# Patient Record
Sex: Female | Born: 1995
Health system: Southern US, Community
[De-identification: ages and names within clinical notes are randomized; demographics above are authoritative.]

---
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
## 2014-07-11 HISTORY — PX: OTHER SURGICAL HISTORY: SHX169

## 2014-08-07 ENCOUNTER — Emergency Department (HOSPITAL_COMMUNITY): Payer: Self-pay

## 2014-08-07 ENCOUNTER — Emergency Department (INDEPENDENT_AMBULATORY_CARE_PROVIDER_SITE_OTHER): Payer: Self-pay

## 2014-08-07 ENCOUNTER — Emergency Department (INDEPENDENT_AMBULATORY_CARE_PROVIDER_SITE_OTHER)
Admission: EM | Admit: 2014-08-07 | Discharge: 2014-08-07 | Disposition: A | Discharge status: Home / Self Care | Payer: Self-pay | Source: Home / Self Care | Attending: Family Medicine | Admitting: Family Medicine

## 2014-08-07 ENCOUNTER — Encounter (HOSPITAL_COMMUNITY): Payer: Self-pay | Admitting: Emergency Medicine

## 2014-08-07 DIAGNOSIS — S63619A Unspecified sprain of unspecified finger, initial encounter: Secondary | ICD-10-CM

## 2014-08-07 MED ORDER — IBUPROFEN 100 MG/5ML PO SUSP
600.0000 mg | Freq: Once | ORAL | Status: AC
  Administered 2014-08-07: 600 mg via ORAL

## 2014-08-07 MED ORDER — IBUPROFEN 100 MG PO CHEW: 600.0000 mg | CHEWABLE_TABLET | Freq: Three times a day (TID) | ORAL | Status: DC | PRN

## 2014-08-07 MED ORDER — IBUPROFEN 100 MG/5ML PO SUSP
ORAL | Status: AC
  Filled 2014-08-07: qty 30
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
## 2014-08-07 NOTE — ED Notes (Signed)
Pt escorted to the lobby for transportation to the main hospital for hand xray.
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
## 2014-08-07 NOTE — ED Notes (Signed)
Xray back up and working, pt brought back to room 9 from the lobby.

## 2014-09-09 ENCOUNTER — Emergency Department (INDEPENDENT_AMBULATORY_CARE_PROVIDER_SITE_OTHER): Payer: Self-pay

## 2014-09-09 ENCOUNTER — Encounter (HOSPITAL_COMMUNITY): Payer: Self-pay | Admitting: Emergency Medicine

## 2014-09-09 ENCOUNTER — Emergency Department (INDEPENDENT_AMBULATORY_CARE_PROVIDER_SITE_OTHER)
Admission: EM | Admit: 2014-09-09 | Discharge: 2014-09-09 | Disposition: A | Discharge status: Home / Self Care | Payer: Self-pay | Source: Home / Self Care | Attending: Family Medicine | Admitting: Family Medicine

## 2014-09-09 DIAGNOSIS — M79641 Pain in right hand: Secondary | ICD-10-CM
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
## 2014-09-09 NOTE — Discharge Instructions (Signed)
Musculoskeletal Pain °Musculoskeletal pain is muscle and boney aches and pains. These pains can occur in any part of the body. Your caregiver may treat you without knowing the cause of the pain. They may treat you if blood or urine tests, X-rays, and other tests were normal.  °CAUSES °There is often not a definite cause or reason for these pains. These pains may be caused by a type of germ (virus). The discomfort may also come from overuse. Overuse includes working out too hard when your body is not fit. Boney aches also come from weather changes. Bone is sensitive to atmospheric pressure changes. °HOME CARE INSTRUCTIONS  °· Ask when your test results will be ready. Make sure you get your test results. °· Only take over-the-counter or prescription medicines for pain, discomfort, or fever as directed by your caregiver. If you were given medications for your condition, do not drive, operate machinery or power tools, or sign legal documents for 24 hours. Do not drink alcohol. Do not take sleeping pills or other medications that may interfere with treatment. °· Continue all activities unless the activities cause more pain. When the pain lessens, slowly resume normal activities. Gradually increase the intensity and duration of the activities or exercise. °· During periods of severe pain, bed rest may be helpful. Lay or sit in any position that is comfortable. °· Putting ice on the injured area. °· Put ice in a bag. °· Place a towel between your skin and the bag. °· Leave the ice on for 15 to 20 minutes, 3 to 4 times a day. °· Follow up with your caregiver for continued problems and no reason can be found for the pain. If the pain becomes worse or does not go away, it may be necessary to repeat tests or do additional testing. Your caregiver may need to look further for a possible cause. °SEEK IMMEDIATE MEDICAL CARE IF: °· You have pain that is getting worse and is not relieved by medications. °· You develop chest pain  that is associated with shortness or breath, sweating, feeling sick to your stomach (nauseous), or throw up (vomit). °· Your pain becomes localized to the abdomen. °· You develop any new symptoms that seem different or that concern you. °MAKE SURE YOU:  °· Understand these instructions. °· Will watch your condition. °· Will get help right away if you are not doing well or get worse. °Document Released: 06/27/2005 Document Revised: 09/19/2011 Document Reviewed: 03/01/2013 °ExitCare® Patient Information ©2015 [employer]. This information is not intended to replace advice given to you by your health care provider. Make sure you discuss any questions you have with your health care provider. ° °Pain of Unknown Etiology (Pain Without a Known Cause) °You have come to your caregiver because of pain. Pain can occur in any part of the body. Often there is not a definite cause. If your laboratory (blood or urine) work was normal and X-rays or other studies were normal, your caregiver may treat you without knowing the cause of the pain. An example of this is the headache. Most headaches are diagnosed by taking a history. This means your caregiver asks you questions about your headaches. Your caregiver determines a treatment based on your answers. Usually testing done for headaches is normal. Often testing is not done unless there is no response to medications. Regardless of where your pain is located today, you can be given medications to make you comfortable. If no physical cause of pain can be found, most   cases of pain will gradually leave as suddenly as they came.  °If you have a painful condition and no reason can be found for the pain, it is important that you follow up with your caregiver. If the pain becomes worse or does not go away, it may be necessary to repeat tests and look further for a possible cause. °· Only take over-the-counter or prescription medicines for pain, discomfort, or fever as directed by your  caregiver. °· For the protection of your privacy, test results cannot be given over the phone. Make sure you receive the results of your test. Ask how these results are to be obtained if you have not been informed. It is your responsibility to obtain your test results. °· You may continue all activities unless the activities cause more pain. When the pain lessens, it is important to gradually resume normal activities. Resume activities by beginning slowly and gradually increasing the intensity and duration of the activities or exercise. During periods of severe pain, bed rest may be helpful. Lie or sit in any position that is comfortable. °· Ice used for acute (sudden) conditions may be effective. Use a large plastic bag filled with ice and wrapped in a towel. This may provide pain relief. °· See your caregiver for continued problems. Your caregiver can help or refer you for exercises or physical therapy if necessary. °If you were given medications for your condition, do not drive, operate machinery or power tools, or sign legal documents for 24 hours. Do not drink alcohol, take sleeping pills, or take other medications that may interfere with treatment. °See your caregiver immediately if you have pain that is becoming worse and not relieved by medications. °Document Released: 03/22/2001 Document Revised: 04/17/2013 Document Reviewed: 06/27/2005 °ExitCare® Patient Information ©2015 [employer]. This information is not intended to replace advice given to you by your health care provider. Make sure you discuss any questions you have with your health care provider. ° °

## 2015-07-28 ENCOUNTER — Ambulatory Visit (INDEPENDENT_AMBULATORY_CARE_PROVIDER_SITE_OTHER): Payer: BC Managed Care – PPO | Admitting: Family Medicine

## 2015-07-28 ENCOUNTER — Encounter: Payer: Self-pay | Admitting: Family Medicine

## 2015-07-28 VITALS — BP 108/72 | HR 69 | Temp 98.9°F | Resp 16 | Ht 65.0 in | Wt 200.0 lb

## 2015-07-28 DIAGNOSIS — J039 Acute tonsillitis, unspecified: Secondary | ICD-10-CM

## 2015-07-28 LAB — POCT RAPID STREP A (OFFICE): Rapid Strep A Screen: NEGATIVE

## 2015-07-28 MED ORDER — AZITHROMYCIN 250 MG PO TABS: ORAL_TABLET | ORAL | Status: DC

## 2015-07-28 NOTE — Progress Notes (Signed)
Subjective:    Patient ID: Laurie Mendez, female    DOB: 10/11/1995, 20 y.o.   MRN: [id number]  HPI: Laurie Mendez is a 20 y.o. female presenting on 07/28/2015 for white bump around tonsil   HPI   Pt presents for possible strep throat. Throat discomfort when she swallows. No fevers at home. No congestion. No nasal drainage. No ear pain or chest tightness.    No past medical history on file.  No current outpatient prescriptions on file prior to visit.   No current facility-administered medications on file prior to visit.    Review of Systems  Constitutional: Negative for fever and chills.  HENT: Positive for sore throat and trouble swallowing. Negative for congestion, ear pain, facial swelling, postnasal drip, rhinorrhea and sinus pressure.   Respiratory: Negative for cough, chest tightness and wheezing.   Cardiovascular: Negative for chest pain, palpitations and leg swelling.  Skin: Negative for rash and wound.   Per HPI unless specifically indicated above     Objective:    BP 108/72 mmHg  Pulse 69  Temp(Src) 98.9 F (37.2 C) (Oral)  Resp 16  Ht  (1.651 m)  Wt 200 lb (90.719 kg)  BMI 33.28 kg/m2  LMP 07/27/2015  Wt Readings from Last 3 Encounters:  07/28/15 200 lb (90.719 kg)  09/11/08 131 lb (59.421 kg) (86 %*, Z = 1.09)  02/20/08 114 lb 8 oz (51.937 kg) (76 %*, Z = 0.72)   * Growth percentiles are based on CDC 2-20 Years data.    Physical Exam  HENT:  Head: Normocephalic and atraumatic.  Right Ear: Hearing and tympanic membrane normal.  Left Ear: Hearing and tympanic membrane normal.  Nose: No mucosal edema or rhinorrhea. Right sinus exhibits no maxillary sinus tenderness and no frontal sinus tenderness. Left sinus exhibits no maxillary sinus tenderness and no frontal sinus tenderness.  Mouth/Throat: Uvula is midline.    Neck: Normal range of motion. Neck supple. No Brudzinski's sign and no Kernig's sign noted.  Cardiovascular: Normal rate and regular  rhythm.  Exam reveals no gallop and no friction rub.   No murmur heard. Pulmonary/Chest: Effort normal and breath sounds normal. No respiratory distress. She has no wheezes. She has no rales.  Lymphadenopathy:    She has no cervical adenopathy.   Results for orders placed or performed in visit on 07/28/15  POCT rapid strep A  Result Value Ref Range   Rapid Strep A Screen Negative Negative      Assessment & Plan:   Problem List Items Addressed This Visit    None    Visit Diagnoses    Exudative tonsillitis    -  Primary    Rapid strep is negative. Sent for culture. Treat empirically due to exudate, beefy red. Zpak for PCN allergy. Supportive care at home. Return if not improved.     Relevant Orders    POCT rapid strep A (Completed)    Grp A Strep       Meds ordered this encounter  Medications  . azithromycin (ZITHROMAX) 250 MG tablet    Sig: Dispense as Z-pak: Take 2 pills today and 1 pill every day until the bottle is empty.    Dispense:  6 tablet    Refill:  0    Order Specific Question:  Supervising Provider    Answer:  Janeann Forehand [[id number]]      Follow up plan: Return if symptoms worsen or fail to improve.

## 2015-07-28 NOTE — Patient Instructions (Signed)

## 2015-07-30 LAB — CULTURE, GROUP A STREP
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
## 2016-01-21 ENCOUNTER — Encounter: Payer: Self-pay | Admitting: Family Medicine

## 2016-01-21 ENCOUNTER — Ambulatory Visit (INDEPENDENT_AMBULATORY_CARE_PROVIDER_SITE_OTHER): Payer: BC Managed Care – PPO | Admitting: Family Medicine

## 2016-01-21 VITALS — BP 97/62 | HR 66 | Temp 98.4°F | Resp 16 | Ht 65.0 in | Wt 191.0 lb

## 2016-01-21 DIAGNOSIS — Z0000 Encounter for general adult medical examination without abnormal findings: Secondary | ICD-10-CM

## 2016-01-21 NOTE — Progress Notes (Signed)
Subjective:    Patient ID: Laurie Mendez, female    DOB: 09/02/1995, 20 y.o.   MRN: [id number]  HPI: Laurie Mendez is a 20 y.o. female presenting on 01/21/2016 for Annual Exam   HPI  Pt presents annual exam. She is attending [employer] and needs paperwork done. Overall doing well. Wears sunscreen when outside. Checks skins. Exercise- regularly. 20minutes on ellipicital 3x per week. Declines GC/chlamydia testing today.  LMP 7/13- no issues. No sexually active.  Non-smoker. No ETOH.   No past medical history on file. Social History   Social History  . Marital Status: Single    Spouse Name: N/A  . Number of Children: N/A  . Years of Education: N/A   Occupational History  . Not on file.   Social History Main Topics  . Smoking status: Never Smoker   . Smokeless tobacco: Never Used  . Alcohol Use: No  . Drug Use: No  . Sexual Activity: Not on file   Other Topics Concern  . Not on file   Social History Narrative   Family History  Problem Relation Age of Onset  . Cancer Neg Hx   . Diabetes Neg Hx   . Heart failure Neg Hx   . Healthy Mother   . Healthy Father   . Healthy Sister   . Healthy Sister    No current outpatient prescriptions on file prior to visit.   No current facility-administered medications on file prior to visit.    Review of Systems  Constitutional: Negative for fever and chills.  HENT: Negative.   Respiratory: Negative for cough, chest tightness and wheezing.   Cardiovascular: Negative for chest pain and leg swelling.  Gastrointestinal: Negative for nausea, vomiting, abdominal pain, diarrhea and constipation.  Endocrine: Negative.  Negative for cold intolerance, heat intolerance, polydipsia, polyphagia and polyuria.  Genitourinary: Negative for dysuria and difficulty urinating.  Musculoskeletal: Negative.   Neurological: Negative for dizziness, light-headedness and numbness.  Psychiatric/Behavioral: Negative.    Per HPI unless specifically indicated  above     Objective:    BP 97/62 mmHg  Pulse 66  Temp(Src) 98.4 F (36.9 C) (Oral)  Resp 16  Ht 5\' 5"  (1.651 m)  Wt 191 lb (86.637 kg)  BMI 31.78 kg/m2  SpO2 100%  LMP 01/21/2016 (Exact Date)  Wt Readings from Last 3 Encounters:  01/21/16 191 lb (86.637 kg)  07/28/15 200 lb (90.719 kg)  09/11/08 131 lb (59.421 kg) (86 %*, Z = 1.09)   * Growth percentiles are based on CDC 2-20 Years data.    Physical Exam  Constitutional: She is oriented to person, place, and time. She appears well-developed and well-nourished.  HENT:  Head: Normocephalic and atraumatic.  Right Ear: Hearing normal. A middle ear effusion is present.  Left Ear: Hearing and tympanic membrane normal.  Nose: Right sinus exhibits no maxillary sinus tenderness and no frontal sinus tenderness. Left sinus exhibits no maxillary sinus tenderness and no frontal sinus tenderness.  Mouth/Throat: Uvula is midline, oropharynx is clear and moist and mucous membranes are normal.  Neck: Neck supple.  Cardiovascular: Normal rate, regular rhythm and normal heart sounds.  Exam reveals no gallop and no friction rub.   No murmur heard. Pulmonary/Chest: Effort normal and breath sounds normal. She has no wheezes. She exhibits no tenderness.  Abdominal: Soft. Normal appearance and bowel sounds are normal. She exhibits no distension and no mass. There is no tenderness. There is no rebound and no guarding.  Musculoskeletal: Normal  range of motion. She exhibits no edema or tenderness.  Lymphadenopathy:    She has no cervical adenopathy.  Neurological: She is alert and oriented to person, place, and time.  Skin: Skin is warm and dry.  Psychiatric: She has a normal mood and affect. Her behavior is normal. Judgment and thought content normal.   Results for orders placed or performed in visit on 07/28/15  Culture, Group A Strep  Result Value Ref Range   Strep A Culture Comment (A)   POCT rapid strep A  Result Value Ref Range   Rapid  Strep A Screen Negative Negative      Assessment & Plan:   Problem List Items Addressed This Visit    None    Visit Diagnoses    Annual physical exam    -  Primary    Well young adult. Cleared for health sciences studies. Health maintenance reviewed. Declined GC/chlamydia testing. Health maintenace labs done.     Relevant Orders    COMPLETE METABOLIC PANEL WITH GFR    Lipid panel       No orders of the defined types were placed in this encounter.      Follow up plan: Return in about 1 year (around 01/20/2017), or if symptoms worsen or fail to improve.

## 2016-01-21 NOTE — Patient Instructions (Signed)
Keep home and car smoke-free Stay physically active (>30-60 minutes 3 times a day) Maximum 1-2 hours of TV & computer a day Wear seatbelts, ensure passengers do too Drive responsibly when you get your license Avoid alcohol, smoking, drug use Ride with designated driver or call for a ride if drinking Abstinence from sex is the best way to avoid pregnancy and STDs Limit sun, use sunscreen Seek help if you feel angry, depressed, or sad often 3 meals a day and healthy snacks Brush teeth twice a day Participate in social activities, sports, community groups Maintain strong family relationships Follow up in 1 year  

## 2016-01-22 LAB — COMPLETE METABOLIC PANEL WITH GFR
ALT: 15 U/L (ref 6–29)
AST: 17 U/L (ref 10–30)
Albumin: 4.2 g/dL (ref 3.6–5.1)
Alkaline Phosphatase: 60 U/L (ref 33–115)
BILIRUBIN TOTAL: 0.7 mg/dL (ref 0.2–1.2)
BUN: 11 mg/dL (ref 7–25)
CO2: 27 mmol/L (ref 20–31)
CREATININE: 0.88 mg/dL (ref 0.50–1.10)
Calcium: 9.6 mg/dL (ref 8.6–10.2)
Chloride: 103 mmol/L (ref 98–110)
GFR, Est Non African American: >89 mL/min (ref >=60)
Glucose, Bld: 91 mg/dL (ref 65–99)
Potassium: 4.1 mmol/L (ref 3.5–5.3)
Sodium: 143 mmol/L (ref 135–146)
TOTAL PROTEIN: 6.7 g/dL (ref 6.1–8.1)

## 2016-01-22 LAB — LIPID PANEL
Cholesterol: 164 mg/dL (ref 125–170)
HDL: 36 mg/dL — ABNORMAL LOW (ref >=46)
LDL CALC: 98 mg/dL (ref <110)
TRIGLYCERIDES: 152 mg/dL — AB (ref <150)
Total CHOL/HDL Ratio: 4.6 Ratio (ref <=5.0)
VLDL: 30 mg/dL (ref <30)

## 2016-03-11 ENCOUNTER — Ambulatory Visit (INDEPENDENT_AMBULATORY_CARE_PROVIDER_SITE_OTHER): Payer: BC Managed Care – PPO | Admitting: Family Medicine

## 2016-03-11 VITALS — BP 117/67 | HR 66 | Temp 97.8°F | Resp 16 | Ht 65.0 in | Wt 194.0 lb

## 2016-03-11 DIAGNOSIS — J029 Acute pharyngitis, unspecified: Secondary | ICD-10-CM

## 2016-03-11 LAB — POCT RAPID STREP A (OFFICE): RAPID STREP A SCREEN: NEGATIVE

## 2016-03-11 NOTE — Progress Notes (Signed)
   Subjective:    Patient ID: Laurie Mendez, female    DOB: 01/26/1996, 20 y.o.   MRN: [id number]  HPI: Laurie Mendez is a 20 y.o. female presenting on 03/11/2016 for Sore Throat (onset yesterday,  HA )   HPI  Pt presents headache and sore throat. Started yesterday. No fevers. Some congestion today. Sicks contacts at school. No cough.   No past medical history on file.  No current outpatient prescriptions on file prior to visit.   No current facility-administered medications on file prior to visit.     Review of Systems  Constitutional: Negative for chills and fever.  HENT: Positive for congestion and sore throat. Negative for ear pain, rhinorrhea, trouble swallowing and voice change.   Respiratory: Negative for cough, chest tightness and wheezing.   Cardiovascular: Negative for chest pain, palpitations and leg swelling.  Gastrointestinal: Negative for vomiting.  Neurological: Positive for headaches.   Per HPI unless specifically indicated above     Objective:    BP 117/67 (BP Location: Right Arm, Patient Position: Sitting, Cuff Size: Normal)   Pulse 66   Temp 97.8 F (36.6 C) (Oral)   Resp 16   Ht 5\' 5"  (1.651 m)   Wt 194 lb (88 kg)   SpO2 100%   BMI 32.28 kg/m   Wt Readings from Last 3 Encounters:  03/11/16 194 lb (88 kg)  01/21/16 191 lb (86.6 kg)  07/28/15 200 lb (90.7 kg)    Physical Exam  Constitutional: She appears well-developed and well-nourished. No distress.  HENT:  Head: Normocephalic and atraumatic.  Right Ear: Hearing and tympanic membrane normal. Tympanic membrane is not erythematous and not bulging.  Left Ear: Hearing normal. Tympanic membrane is not erythematous and not bulging. A middle ear effusion is present.  Nose: Mucosal edema present. No sinus tenderness or nasal septal hematoma. Right sinus exhibits no maxillary sinus tenderness and no frontal sinus tenderness. Left sinus exhibits no maxillary sinus tenderness and no frontal sinus tenderness.    Mouth/Throat: Uvula is midline and mucous membranes are normal. No uvula swelling. Posterior oropharyngeal erythema present. No posterior oropharyngeal edema or tonsillar abscesses.  Mild erythema of the throat- consistent with viral illness  Neck: Neck supple. No Brudzinski's sign and no Kernig's sign noted.  Cardiovascular: Normal rate, regular rhythm and normal heart sounds.   Pulmonary/Chest: Breath sounds normal. No accessory muscle usage. No tachypnea. No respiratory distress.  Lymphadenopathy:    She has no cervical adenopathy.   Results for orders placed or performed in visit on 03/11/16  POCT rapid strep A  Result Value Ref Range   Rapid Strep A Screen Negative Negative      Assessment & Plan:   Problem List Items Addressed This Visit    None    Visit Diagnoses    Pharyngitis    -  Primary   Likely viral etiology. Rapid strep is negative. Supportive care. Will cutlure and treat if positive. Return if not improving.    Relevant Orders   POCT rapid strep A (Completed)   Culture, Group A Strep      Meds ordered this encounter  Medications  . ENGERIX-B 20 MCG/ML injection      Follow up plan: Return if symptoms worsen or fail to improve.

## 2016-03-11 NOTE — Patient Instructions (Addendum)

## 2016-03-13 LAB — CULTURE, GROUP A STREP: Organism ID, Bacteria: NORMAL

## 2020-11-12 ENCOUNTER — Encounter: Payer: Self-pay | Admitting: Family Medicine

## 2020-11-12 ENCOUNTER — Ambulatory Visit (INDEPENDENT_AMBULATORY_CARE_PROVIDER_SITE_OTHER): Payer: No Typology Code available for payment source | Admitting: Family Medicine

## 2020-11-12 ENCOUNTER — Other Ambulatory Visit: Payer: Self-pay

## 2020-11-12 VITALS — BP 112/61 | HR 95 | Ht 64.5 in | Wt 232.6 lb

## 2020-11-12 DIAGNOSIS — Z1159 Encounter for screening for other viral diseases: Secondary | ICD-10-CM | POA: Diagnosis not present

## 2020-11-12 DIAGNOSIS — E669 Obesity, unspecified: Secondary | ICD-10-CM

## 2020-11-12 DIAGNOSIS — Z114 Encounter for screening for human immunodeficiency virus [HIV]: Secondary | ICD-10-CM

## 2020-11-12 DIAGNOSIS — Z0000 Encounter for general adult medical examination without abnormal findings: Secondary | ICD-10-CM

## 2020-11-12 NOTE — Patient Instructions (Addendum)
Thank you for coming to the office today.  Labs today  OTC Peppermint Oil (Triple Coated Capsule) 180mg  take one 3 times daily to reduce diarrhea   Please schedule a Follow-up Appointment to: Return in about 1 year (around 11/12/2021) for 1 year Annual Physical (AM apt, fasting lab AFTER).  If you have any other questions or concerns, please feel free to call the office or send a message through MyChart. You may also schedule an earlier appointment if necessary.  Additionally, you may be receiving a survey about your experience at our office within a few days to 1 week by e-mail or mail. We value your feedback.  01/12/2022, DO [facility], [facility]

## 2020-11-12 NOTE — Progress Notes (Signed)
Subjective:    Patient ID: Laurie Mendez, female    DOB: 10/29/1995, 25 y.o.   MRN: [id number]  Laurie Mendez is a 25 y.o. female presenting on 11/12/2020 for Annual Exam   HPI   Here for Annual Physical and due for fasting labs. Biometric for Physical / [employer] Employee  She works as Publishing rights manager in [employer]  Obesity BMI >39 Gradual wt gain over past 5 years. Lifestyle Diet - balanced, meal prep daily Exercise - days off work 2 days a week 30 min approx cardio  GERD Suspected GI IBS with predominant diarrhea type On Nexium 20mg  OTC mostly as needed, not taking every day. Can take for 1 week at a time or PRN. Avoids trigger foods.  Yearly with OBGYN / [facility] On Depo  Health Maintenance:  UTD TDap 2016 UTD COVID Vaccine, booster Due HPV Vaccine  Last pap smear [facility] 10/22/20 - reported negative, will get copy of result, last pap was 10/2019 negative.  Depression screen PHQ 2/9 11/12/2020  Decreased Interest 0  Down, Depressed, Hopeless 0  PHQ - 2 Score 0    History reviewed. No pertinent past medical history. Past Surgical History:  Procedure Laterality Date  . oral surgery  2016   wisdome teeth    Social History   Socioeconomic History  . Marital status: Single    Spouse name: Not on file  . Number of children: Not on file  . Years of education: Not on file  . Highest education level: Not on file  Occupational History  . Not on file  Tobacco Use  . Smoking status: Never Smoker  . Smokeless tobacco: Never Used  Substance and Sexual Activity  . Alcohol use: No  . Drug use: No  . Sexual activity: Not on file  Other Topics Concern  . Not on file  Social History Narrative  . Not on file   Social Determinants of Health   Financial Resource Strain: Not on file  Food Insecurity: Not on file  Transportation Needs: Not on file  Physical Activity: Not on file  Stress: Not on file  Social Connections: Not on  file  Intimate Partner Violence: Not on file   Family History  Problem Relation Age of Onset  . Healthy Mother   . Healthy Father   . Healthy Sister   . Healthy Sister   . Atrial fibrillation Maternal Grandmother   . Suicidality Maternal Grandfather   . Depression Maternal Grandfather   . Prostate cancer Maternal Grandfather   . Diabetes Maternal Grandfather   . Heart disease Paternal Grandfather   . Cancer Neg Hx   . Heart failure Neg Hx    Current Outpatient Medications on File Prior to Visit  Medication Sig  . esomeprazole (NEXIUM) 20 MG capsule Take 20 mg by mouth daily at 12 noon.  . medroxyPROGESTERone Acetate 150 MG/ML SUSY Inject 1 mL into the muscle every 3 (three) months.   No current facility-administered medications on file prior to visit.    Review of Systems  Constitutional: Negative for activity change, appetite change, chills, diaphoresis, fatigue and fever.  HENT: Negative for congestion and hearing loss.   Eyes: Negative for visual disturbance.  Respiratory: Negative for cough, chest tightness, shortness of breath and wheezing.   Cardiovascular: Negative for chest pain, palpitations and leg swelling.  Gastrointestinal: Negative for abdominal pain, constipation, diarrhea, nausea and vomiting.  Genitourinary: Negative for dysuria, frequency and hematuria.  Musculoskeletal: Negative for arthralgias and neck pain.  Skin: Negative for rash.  Allergic/Immunologic: Negative for environmental allergies.  Neurological: Negative for dizziness, weakness, light-headedness, numbness and headaches.  Hematological: Negative for adenopathy.  Psychiatric/Behavioral: Negative for behavioral problems, dysphoric mood and sleep disturbance.   Per HPI unless specifically indicated above     Objective:    BP 112/61   Pulse 95   Ht 5' 4.5" (1.638 m)   Wt 232 lb 9.6 oz (105.5 kg)   SpO2 100%   BMI 39.31 kg/m   Wt Readings from Last 3 Encounters:  11/12/20 232 lb 9.6 oz  (105.5 kg)  03/11/16 194 lb (88 kg)  01/21/16 191 lb (86.6 kg)    Physical Exam Vitals and nursing note reviewed.  Constitutional:      General: She is not in acute distress.    Appearance: She is well-developed. She is obese. She is not diaphoretic.     Comments: Well-appearing, comfortable, cooperative  HENT:     Head: Normocephalic and atraumatic.     Right Ear: Tympanic membrane, ear canal and external ear normal. There is no impacted cerumen.     Left Ear: Tympanic membrane, ear canal and external ear normal. There is no impacted cerumen.  Eyes:     General:        Right eye: No discharge.        Left eye: No discharge.     Conjunctiva/sclera: Conjunctivae normal.     Pupils: Pupils are equal, round, and reactive to light.  Neck:     Thyroid: No thyromegaly.  Cardiovascular:     Rate and Rhythm: Normal rate and regular rhythm.     Heart sounds: Normal heart sounds. No murmur heard.   Pulmonary:     Effort: Pulmonary effort is normal. No respiratory distress.     Breath sounds: Normal breath sounds. No wheezing or rales.  Abdominal:     General: Bowel sounds are normal. There is no distension.     Palpations: Abdomen is soft. There is no mass.     Tenderness: There is no abdominal tenderness.  Musculoskeletal:        General: No tenderness. Normal range of motion.     Cervical back: Normal range of motion and neck supple.     Right lower leg: No edema.     Left lower leg: No edema.     Comments: Upper / Lower Extremities: - Normal muscle tone, strength bilateral upper extremities 5/5, lower extremities 5/5  Lymphadenopathy:     Cervical: No cervical adenopathy.  Skin:    General: Skin is warm and dry.     Findings: No erythema or rash.  Neurological:     Mental Status: She is alert and oriented to person, place, and time.     Comments: Distal sensation intact to light touch all extremities  Psychiatric:        Behavior: Behavior normal.     Comments: Well  groomed, good eye contact, normal speech and thoughts       Results for orders placed or performed in visit on 03/11/16  Culture, Group A Strep   Specimen: Throat  Result Value Ref Range   Organism ID, Bacteria Normal Upper Respiratory Flora    Organism ID, Bacteria No Beta Hemolytic Streptococci Isolated   POCT rapid strep A  Result Value Ref Range   Rapid Strep A Screen Negative Negative      Assessment & Plan:   Problem List  Items Addressed This Visit   None   Visit Diagnoses    Annual physical exam    -  Primary   Relevant Orders   COMPLETE METABOLIC PANEL WITH GFR   Lipid panel   CBC with Differential/Platelet   Need for hepatitis C screening test       Relevant Orders   Hepatitis C antibody   Screening for HIV (human immunodeficiency virus)       Relevant Orders   HIV Antibody (routine testing w rflx)   Obesity (BMI 35.0-39.9 without comorbidity)          Updated Health Maintenance information Request copy of last pap smear from [facility] 10/22/20 reported negative, last 10/2019 negative Fasting labs ordered today to be drawn, including biometric and routine labs Add screen routine Hep C / HIV Review [facility] record, due for HPV vaccine she will check into this and return when ready. Encouraged improvement to lifestyle with diet and exercise Goal of weight loss   No orders of the defined types were placed in this encounter.   Follow up plan: Return in about 1 year (around 11/12/2021) for 1 year Annual Physical (AM apt, fasting lab AFTER).  Saralyn Pilar, DO [facility] 11/12/2020, 10:14 AM

## 2020-11-13 LAB — CBC WITH DIFFERENTIAL/PLATELET
Absolute Monocytes: 544 cells/uL (ref 200–950)
Basophils Absolute: 48 cells/uL (ref 0–200)
Basophils Relative: 0.6 %
Eosinophils Absolute: 168 cells/uL (ref 15–500)
Eosinophils Relative: 2.1 %
HCT: 47 % — ABNORMAL HIGH (ref 35.0–45.0)
Hemoglobin: 15.5 g/dL (ref 11.7–15.5)
Lymphs Abs: 2296 cells/uL (ref 850–3900)
MCH: 30.7 pg (ref 27.0–33.0)
MCHC: 33 g/dL (ref 32.0–36.0)
MCV: 93.1 fL (ref 80.0–100.0)
MPV: 9.8 fL (ref 7.5–12.5)
Monocytes Relative: 6.8 %
Neutro Abs: 4944 cells/uL (ref 1500–7800)
Neutrophils Relative %: 61.8 %
Platelets: 421 10*3/uL — ABNORMAL HIGH (ref 140–400)
RBC: 5.05 10*6/uL (ref 3.80–5.10)
RDW: 12.7 % (ref 11.0–15.0)
Total Lymphocyte: 28.7 %
WBC: 8 10*3/uL (ref 3.8–10.8)

## 2020-11-13 LAB — HEPATITIS C ANTIBODY
Hepatitis C Ab: NONREACTIVE
SIGNAL TO CUT-OFF: 0 (ref <1.00)

## 2020-11-13 LAB — COMPLETE METABOLIC PANEL WITH GFR
AG Ratio: 1.7 (calc) (ref 1.0–2.5)
ALT: 34 U/L — ABNORMAL HIGH (ref 6–29)
AST: 23 U/L (ref 10–30)
Albumin: 4.7 g/dL (ref 3.6–5.1)
Alkaline phosphatase (APISO): 89 U/L (ref 31–125)
BUN: 10 mg/dL (ref 7–25)
CO2: 29 mmol/L (ref 20–32)
Calcium: 10.2 mg/dL (ref 8.6–10.2)
Chloride: 104 mmol/L (ref 98–110)
Creat: 0.73 mg/dL (ref 0.50–1.10)
GFR, Est African American: 133 mL/min/{1.73_m2} (ref >=60)
GFR, Est Non African American: 114 mL/min/{1.73_m2} (ref >=60)
Globulin: 2.7 g/dL (calc) (ref 1.9–3.7)
Glucose, Bld: 89 mg/dL (ref 65–99)
Potassium: 4.4 mmol/L (ref 3.5–5.3)
Sodium: 140 mmol/L (ref 135–146)
Total Bilirubin: 1.2 mg/dL (ref 0.2–1.2)
Total Protein: 7.4 g/dL (ref 6.1–8.1)

## 2020-11-13 LAB — LIPID PANEL
Cholesterol: 200 mg/dL — ABNORMAL HIGH (ref <200)
HDL: 41 mg/dL — ABNORMAL LOW (ref >=50)
LDL Cholesterol (Calc): 127 mg/dL (calc) — ABNORMAL HIGH
Non-HDL Cholesterol (Calc): 159 mg/dL (calc) — ABNORMAL HIGH (ref <130)
Total CHOL/HDL Ratio: 4.9 (calc) (ref <5.0)
Triglycerides: 196 mg/dL — ABNORMAL HIGH (ref <150)

## 2020-11-13 LAB — HIV ANTIBODY (ROUTINE TESTING W REFLEX): HIV 1&2 Ab, 4th Generation: NONREACTIVE

## 2021-07-12 ENCOUNTER — Telehealth: Payer: Self-pay | Admitting: Physician Assistant

## 2021-07-12 DIAGNOSIS — J019 Acute sinusitis, unspecified: Secondary | ICD-10-CM

## 2021-07-12 DIAGNOSIS — B9689 Other specified bacterial agents as the cause of diseases classified elsewhere: Secondary | ICD-10-CM

## 2021-07-12 MED ORDER — DOXYCYCLINE HYCLATE 100 MG PO TABS: 100.0000 mg | ORAL_TABLET | Freq: Two times a day (BID) | ORAL | 0 refills | Status: DC

## 2021-07-12 NOTE — Progress Notes (Signed)

## 2021-09-07 ENCOUNTER — Telehealth: Payer: No Typology Code available for payment source | Admitting: Physician Assistant

## 2021-09-07 DIAGNOSIS — B9689 Other specified bacterial agents as the cause of diseases classified elsewhere: Secondary | ICD-10-CM

## 2021-09-07 DIAGNOSIS — J019 Acute sinusitis, unspecified: Secondary | ICD-10-CM

## 2021-09-07 MED ORDER — DOXYCYCLINE HYCLATE 100 MG PO TABS: 100.0000 mg | ORAL_TABLET | Freq: Two times a day (BID) | ORAL | 0 refills | Status: DC

## 2021-09-07 NOTE — Progress Notes (Signed)

## 2021-09-07 NOTE — Progress Notes (Signed)
I have spent 5 minutes in review of e-visit questionnaire, review and updating patient chart, medical decision making and response to patient.   Jordin Vicencio [employer], PA-C    

## 2021-11-01 ENCOUNTER — Telehealth: Payer: No Typology Code available for payment source | Admitting: Physician Assistant

## 2021-11-01 DIAGNOSIS — J020 Streptococcal pharyngitis: Secondary | ICD-10-CM

## 2021-11-01 MED ORDER — CLINDAMYCIN HCL 300 MG PO CAPS: 300.0000 mg | ORAL_CAPSULE | Freq: Three times a day (TID) | ORAL | 0 refills | Status: DC

## 2021-11-01 NOTE — Progress Notes (Signed)
E-Visit for Sore Throat - Strep Symptoms  We are sorry that you are not feeling well.  Here is how we plan to help!  Based on what you have shared with me it is likely that you have strep pharyngitis.  Strep pharyngitis is inflammation and infection in the back of the throat.  This is an infection cause by bacteria and is treated with antibiotics.  I have prescribed Clindamycin 300 mg three times a day for 10 days. For throat pain, we recommend over the counter oral pain relief medications such as acetaminophen or aspirin, or anti-inflammatory medications such as ibuprofen or naproxen sodium. Topical treatments such as oral throat lozenges or sprays may be used as needed. Strep infections are not as easily transmitted as other respiratory infections, however we still recommend that you avoid close contact with loved ones, especially the very young and elderly.  Remember to wash your hands thoroughly throughout the day as this is the number one way to prevent the spread of infection and wipe down door knobs and counters with disinfectant.   Home Care: Only take medications as instructed by your medical team. Complete the entire course of an antibiotic. Do not take these medications with alcohol. A steam or ultrasonic humidifier can help congestion.  You can place a towel over your head and breathe in the steam from hot water coming from a faucet. Avoid close contacts especially the very young and the elderly. Cover your mouth when you cough or sneeze. Always remember to wash your hands.  Get Help Right Away If: You develop worsening fever or sinus pain. You develop a severe head ache or visual changes. Your symptoms persist after you have completed your treatment plan.  Make sure you Understand these instructions. Will watch your condition. Will get help right away if you are not doing well or get worse.   Thank you for choosing an e-visit.  Your e-visit answers were reviewed by a board  certified advanced clinical practitioner to complete your personal care plan. Depending upon the condition, your plan could have included both over the counter or prescription medications.  Please review your pharmacy choice. Make sure the pharmacy is open so you can pick up prescription now. If there is a problem, you may contact your provider through MyChart messaging and have the prescription routed to another pharmacy.  Your safety is important to us. If you have drug allergies check your prescription carefully.   For the next 24 hours you can use MyChart to ask questions about today's visit, request a non-urgent call back, or ask for a work or school excuse. You will get an email in the next two days asking about your experience. I hope that your e-visit has been valuable and will speed your recovery.  I provided 5 minutes of non face-to-face time during this encounter for chart review and documentation.   

## 2021-11-18 ENCOUNTER — Ambulatory Visit (INDEPENDENT_AMBULATORY_CARE_PROVIDER_SITE_OTHER): Payer: No Typology Code available for payment source | Admitting: Family Medicine

## 2021-11-18 ENCOUNTER — Encounter: Payer: Self-pay | Admitting: Family Medicine

## 2021-11-18 VITALS — BP 126/78 | HR 74 | Ht 65.0 in | Wt 230.8 lb

## 2021-11-18 DIAGNOSIS — Z0000 Encounter for general adult medical examination without abnormal findings: Secondary | ICD-10-CM | POA: Diagnosis not present

## 2021-11-18 DIAGNOSIS — Z23 Encounter for immunization: Secondary | ICD-10-CM

## 2021-11-18 DIAGNOSIS — Z131 Encounter for screening for diabetes mellitus: Secondary | ICD-10-CM | POA: Diagnosis not present

## 2021-11-18 DIAGNOSIS — E782 Mixed hyperlipidemia: Secondary | ICD-10-CM | POA: Diagnosis not present

## 2021-11-18 NOTE — Patient Instructions (Addendum)
Thank you for coming to the office today. ? ?Gardisil HPV vaccine 0, 2, 6 months ? ?1st dose today, next dose nurse visit July 2023, then final dose November 2023. ? ?Request copy of Pap Smear from GYN ? ?Labs today, stay tuned results. ? ?Please schedule a Follow-up Appointment to: Return in about 1 year (around 11/19/2022) for Nurse visits July Dose#2 HPV, and Nov Dose#3, 1 year Annual Physical AM apt fasting lab AFTER. ? ?If you have any other questions or concerns, please feel free to call the office or send a message through MyChart. You may also schedule an earlier appointment if necessary. ? ?Additionally, you may be receiving a survey about your experience at our office within a few days to 1 week by e-mail or mail. We value your feedback. ? ?Saralyn Pilar, DO ?[facility], [place] ?

## 2021-11-18 NOTE — Progress Notes (Signed)
? ?Subjective:  ? ? Patient ID: Laurie Mendez, female    DOB: [birth year], 26 y.o.   MRN: [id number] ? ?Laurie Mendez is a 26 y.o. female presenting on 11/18/2021 for Annual Exam ? ? ?HPI ? ?Here for Annual Physical and due for fasting labs. ?Biometric for Physical / [employer] Employee ?   ?Obesity BMI >38 ?Weight down 2 lbs in past 1 year. ?Lifestyle ?Diet - balanced, meal prep daily ?Exercise - days off work 2 days a week 30 min approx cardio ?  ?GERD ?Suspected GI IBS with predominant diarrhea type ?On Nexium 20mg  OTC mostly as needed, not taking every day. Can take for 1 week at a time or PRN. Avoids trigger foods. ?  ?Yearly with OBGYN / [facility] ?On Depo ? ?History of Sinus infections ?Last 2 in Jan, March now has lingering sinus drainage ?  ?Health Maintenance: ?  ?UTD TDap 2016 ?UTD COVID Vaccine, booster ?Due HPV Vaccine dose #1 due today ?  ?Cervical CA Screening - [facility] [place] upcoming scheduled for 12/09/21 ?Previously reported negative, will get copy of result, last pap was 10/2019 negative. ? ? ?  11/18/2021  ? 10:50 AM 11/12/2020  ? 10:46 AM  ?Depression screen PHQ 2/9  ?Decreased Interest 0 0  ?Down, Depressed, Hopeless 0 0  ?PHQ - 2 Score 0 0  ?Altered sleeping 0   ?Tired, decreased energy 0   ?Change in appetite 0   ?Feeling bad or failure about yourself  0   ?Trouble concentrating 0   ?Moving slowly or fidgety/restless 0   ?Suicidal thoughts 0   ?PHQ-9 Score 0   ?Difficult doing work/chores Not difficult at all   ? ? ?History reviewed. No pertinent past medical history. ?Past Surgical History:  ?Procedure Laterality Date  ? oral surgery  2016  ? wisdome teeth   ? ?Social History  ? ?Socioeconomic History  ? Marital status: Single  ?  Spouse name: Not on file  ? Number of children: Not on file  ? Years of education: Not on file  ? Highest education level: Not on file  ?Occupational History  ? Not on file  ?Tobacco Use  ? Smoking status: Never  ? Smokeless tobacco: Never   ?Substance and Sexual Activity  ? Alcohol use: No  ? Drug use: No  ? Sexual activity: Not on file  ?Other Topics Concern  ? Not on file  ?Social History Narrative  ? Not on file  ? ?Social Determinants of Health  ? ?Financial Resource Strain: Not on file  ?Food Insecurity: Not on file  ?Transportation Needs: Not on file  ?Physical Activity: Not on file  ?Stress: Not on file  ?Social Connections: Not on file  ?Intimate Partner Violence: Not on file  ? ?Family History  ?Problem Relation Age of Onset  ? Healthy Mother   ? Healthy Father   ? Healthy Sister   ? Healthy Sister   ? Atrial fibrillation Maternal Grandmother   ? Suicidality Maternal Grandfather   ? Depression Maternal Grandfather   ? Prostate cancer Maternal Grandfather   ? Diabetes Maternal Grandfather   ? Heart disease Paternal Grandfather   ? Cancer Neg Hx   ? Heart failure Neg Hx   ? ?Current Outpatient Medications on File Prior to Visit  ?Medication Sig  ? esomeprazole (NEXIUM) 20 MG capsule Take 20 mg by mouth daily at 12 noon.  ? medroxyPROGESTERone Acetate 150 MG/ML SUSY Inject 1 mL into  the muscle every 3 (three) months.  ? Multiple Vitamin (MULTIVITAMIN) tablet Take 1 tablet by mouth daily.  ? ?No current facility-administered medications on file prior to visit.  ? ? ?Review of Systems  ?Constitutional:  Negative for activity change, appetite change, chills, diaphoresis, fatigue and fever.  ?HENT:  Negative for congestion and hearing loss.   ?Eyes:  Negative for visual disturbance.  ?Respiratory:  Negative for cough, chest tightness, shortness of breath and wheezing.   ?Cardiovascular:  Negative for chest pain, palpitations and leg swelling.  ?Gastrointestinal:  Negative for abdominal pain, constipation, diarrhea, nausea and vomiting.  ?Genitourinary:  Negative for dysuria, frequency and hematuria.  ?Musculoskeletal:  Negative for arthralgias and neck pain.  ?Skin:  Negative for rash.  ?Neurological:  Negative for dizziness, weakness,  light-headedness, numbness and headaches.  ?Hematological:  Negative for adenopathy.  ?Psychiatric/Behavioral:  Negative for behavioral problems, dysphoric mood and sleep disturbance.   ?Per HPI unless specifically indicated above ? ?   ?Objective:  ?  ?BP 126/78   Pulse 74   Ht 5\' 5"  (1.651 m)   Wt 230 lb 12.8 oz (104.7 kg)   SpO2 99%   BMI 38.41 kg/m?   ?Wt Readings from Last 3 Encounters:  ?11/18/21 230 lb 12.8 oz (104.7 kg)  ?11/12/20 232 lb 9.6 oz (105.5 kg)  ?03/11/16 194 lb (88 kg)  ?  ?Physical Exam ?Vitals and nursing note reviewed.  ?Constitutional:   ?   General: She is not in acute distress. ?   Appearance: She is well-developed. She is obese. She is not diaphoretic.  ?   Comments: Well-appearing, comfortable, cooperative  ?HENT:  ?   Head: Normocephalic and atraumatic.  ?Eyes:  ?   General:     ?   Right eye: No discharge.     ?   Left eye: No discharge.  ?   Conjunctiva/sclera: Conjunctivae normal.  ?   Pupils: Pupils are equal, round, and reactive to light.  ?Neck:  ?   Thyroid: No thyromegaly.  ?   Vascular: No carotid bruit.  ?Cardiovascular:  ?   Rate and Rhythm: Normal rate and regular rhythm.  ?   Pulses: Normal pulses.  ?   Heart sounds: Normal heart sounds. No murmur heard. ?Pulmonary:  ?   Effort: Pulmonary effort is normal. No respiratory distress.  ?   Breath sounds: Normal breath sounds. No wheezing or rales.  ?Abdominal:  ?   General: Bowel sounds are normal. There is no distension.  ?   Palpations: Abdomen is soft. There is no mass.  ?   Tenderness: There is no abdominal tenderness.  ?Musculoskeletal:     ?   General: No tenderness. Normal range of motion.  ?   Cervical back: Normal range of motion and neck supple.  ?   Right lower leg: No edema.  ?   Left lower leg: No edema.  ?   Comments: Upper / Lower Extremities: ?- Normal muscle tone, strength bilateral upper extremities 5/5, lower extremities 5/5  ?Lymphadenopathy:  ?   Cervical: No cervical adenopathy.  ?Skin: ?   General:  Skin is warm and dry.  ?   Findings: No erythema or rash.  ?Neurological:  ?   Mental Status: She is alert and oriented to person, place, and time.  ?   Comments: Distal sensation intact to light touch all extremities  ?Psychiatric:     ?   Mood and Affect: Mood normal.     ?  Behavior: Behavior normal.     ?   Thought Content: Thought content normal.  ?   Comments: Well groomed, good eye contact, normal speech and thoughts  ? ? ? ?Results for orders placed or performed in visit on 11/12/20  ?COMPLETE METABOLIC PANEL WITH GFR  ?Result Value Ref Range  ? Glucose, Bld 89 65 - 99 mg/dL  ? BUN 10 7 - 25 mg/dL  ? Creat 0.73 0.50 - 1.10 mg/dL  ? GFR, Est Non African American 114 > OR = 60 mL/min/1.58m2  ? GFR, Est African American 133 > OR = 60 mL/min/1.37m2  ? BUN/Creatinine Ratio NOT APPLICABLE 6 - 22 (calc)  ? Sodium 140 135 - 146 mmol/L  ? Potassium 4.4 3.5 - 5.3 mmol/L  ? Chloride 104 98 - 110 mmol/L  ? CO2 29 20 - 32 mmol/L  ? Calcium 10.2 8.6 - 10.2 mg/dL  ? Total Protein 7.4 6.1 - 8.1 g/dL  ? Albumin 4.7 3.6 - 5.1 g/dL  ? Globulin 2.7 1.9 - 3.7 g/dL (calc)  ? AG Ratio 1.7 1.0 - 2.5 (calc)  ? Total Bilirubin 1.2 0.2 - 1.2 mg/dL  ? Alkaline phosphatase (APISO) 89 31 - 125 U/L  ? AST 23 10 - 30 U/L  ? ALT 34 (H) 6 - 29 U/L  ?Lipid panel  ?Result Value Ref Range  ? Cholesterol 200 (H) <200 mg/dL  ? HDL 41 (L) > OR = 50 mg/dL  ? Triglycerides 196 (H) <150 mg/dL  ? LDL Cholesterol (Calc) 127 (H) mg/dL (calc)  ? Total CHOL/HDL Ratio 4.9 <5.0 (calc)  ? Non-HDL Cholesterol (Calc) 159 (H) <130 mg/dL (calc)  ?CBC with Differential/Platelet  ?Result Value Ref Range  ? WBC 8.0 3.8 - 10.8 Thousand/uL  ? RBC 5.05 3.80 - 5.10 Million/uL  ? Hemoglobin 15.5 11.7 - 15.5 g/dL  ? HCT 47.0 (H) 35.0 - 45.0 %  ? MCV 93.1 80.0 - 100.0 fL  ? MCH 30.7 27.0 - 33.0 pg  ? MCHC 33.0 32.0 - 36.0 g/dL  ? RDW 12.7 11.0 - 15.0 %  ? Platelets 421 (H) 140 - 400 Thousand/uL  ? MPV 9.8 7.5 - 12.5 fL  ? Neutro Abs 4,944 1,500 - 7,800 cells/uL  ? Lymphs  Abs 2,296 850 - 3,900 cells/uL  ? Absolute Monocytes 544 200 - 950 cells/uL  ? Eosinophils Absolute 168 15 - 500 cells/uL  ? Basophils Absolute 48 0 - 200 cells/uL  ? Neutrophils Relative % 61.8 %  ? Total Lymphocyte 28.7

## 2021-11-19 LAB — CBC WITH DIFFERENTIAL/PLATELET
Absolute Monocytes: 580 cells/uL (ref 200–950)
Basophils Absolute: 50 cells/uL (ref 0–200)
Basophils Relative: 0.6 %
Eosinophils Absolute: 193 cells/uL (ref 15–500)
Eosinophils Relative: 2.3 %
HCT: 43.6 % (ref 35.0–45.0)
Hemoglobin: 14.7 g/dL (ref 11.7–15.5)
Lymphs Abs: 2209 cells/uL (ref 850–3900)
MCH: 31.6 pg (ref 27.0–33.0)
MCHC: 33.7 g/dL (ref 32.0–36.0)
MCV: 93.8 fL (ref 80.0–100.0)
MPV: 10 fL (ref 7.5–12.5)
Monocytes Relative: 6.9 %
Neutro Abs: 5368 cells/uL (ref 1500–7800)
Neutrophils Relative %: 63.9 %
Platelets: 401 10*3/uL — ABNORMAL HIGH (ref 140–400)
RBC: 4.65 10*6/uL (ref 3.80–5.10)
RDW: 12.8 % (ref 11.0–15.0)
Total Lymphocyte: 26.3 %
WBC: 8.4 10*3/uL (ref 3.8–10.8)

## 2021-11-19 LAB — COMPLETE METABOLIC PANEL WITH GFR
AG Ratio: 1.6 (calc) (ref 1.0–2.5)
ALT: 27 U/L (ref 6–29)
AST: 18 U/L (ref 10–30)
Albumin: 4.5 g/dL (ref 3.6–5.1)
Alkaline phosphatase (APISO): 78 U/L (ref 31–125)
BUN: 14 mg/dL (ref 7–25)
CO2: 24 mmol/L (ref 20–32)
Calcium: 9.9 mg/dL (ref 8.6–10.2)
Chloride: 105 mmol/L (ref 98–110)
Creat: 0.69 mg/dL (ref 0.50–0.96)
Globulin: 2.8 g/dL (calc) (ref 1.9–3.7)
Glucose, Bld: 88 mg/dL (ref 65–99)
Potassium: 4.4 mmol/L (ref 3.5–5.3)
Sodium: 141 mmol/L (ref 135–146)
Total Bilirubin: 1 mg/dL (ref 0.2–1.2)
Total Protein: 7.3 g/dL (ref 6.1–8.1)
eGFR: 123 mL/min/{1.73_m2} (ref >=60)

## 2021-11-19 LAB — LIPID PANEL
Cholesterol: 195 mg/dL (ref <200)
HDL: 43 mg/dL — ABNORMAL LOW (ref >=50)
LDL Cholesterol (Calc): 120 mg/dL (calc) — ABNORMAL HIGH
Non-HDL Cholesterol (Calc): 152 mg/dL (calc) — ABNORMAL HIGH (ref <130)
Total CHOL/HDL Ratio: 4.5 (calc) (ref <5.0)
Triglycerides: 205 mg/dL — ABNORMAL HIGH (ref <150)

## 2021-11-19 LAB — HEMOGLOBIN A1C
Hgb A1c MFr Bld: 5.4 % of total Hgb (ref <5.7)
Mean Plasma Glucose: 108 mg/dL
eAG (mmol/L): 6 mmol/L

## 2021-11-19 LAB — TSH: TSH: 0.82 mIU/L

## 2021-11-19 NOTE — Addendum Note (Signed)
Addended by: Burnell Blanks on: 11/19/2021 01:52 PM ? ? Modules accepted: Orders ? ?

## 2021-12-19 ENCOUNTER — Encounter: Payer: Self-pay | Admitting: Family Medicine

## 2022-01-19 ENCOUNTER — Encounter: Payer: Self-pay | Admitting: Family Medicine

## 2022-01-20 ENCOUNTER — Ambulatory Visit: Payer: No Typology Code available for payment source

## 2022-05-26 ENCOUNTER — Ambulatory Visit: Payer: No Typology Code available for payment source

## 2022-07-21 ENCOUNTER — Other Ambulatory Visit: Payer: Self-pay

## 2022-07-21 MED ORDER — MEDROXYPROGESTERONE ACETATE 150 MG/ML IM SUSY
1.0000 mL | PREFILLED_SYRINGE | INTRAMUSCULAR | 3 refills | Status: DC
  Filled 2022-07-21: qty 1, 84d supply, fill #0
  Filled 2022-08-09: qty 1, 90d supply, fill #0

## 2022-07-22 ENCOUNTER — Encounter: Payer: Self-pay | Admitting: Family Medicine

## 2022-08-05 ENCOUNTER — Other Ambulatory Visit: Payer: Self-pay

## 2022-08-09 ENCOUNTER — Other Ambulatory Visit: Payer: Self-pay

## 2022-11-24 ENCOUNTER — Ambulatory Visit (INDEPENDENT_AMBULATORY_CARE_PROVIDER_SITE_OTHER): Payer: 59 | Admitting: Family Medicine

## 2022-11-24 ENCOUNTER — Encounter: Payer: Self-pay | Admitting: Family Medicine

## 2022-11-24 VITALS — BP 116/78 | HR 96 | Ht 65.0 in | Wt 230.0 lb

## 2022-11-24 DIAGNOSIS — E782 Mixed hyperlipidemia: Secondary | ICD-10-CM | POA: Diagnosis not present

## 2022-11-24 DIAGNOSIS — Z131 Encounter for screening for diabetes mellitus: Secondary | ICD-10-CM

## 2022-11-24 DIAGNOSIS — Z0000 Encounter for general adult medical examination without abnormal findings: Secondary | ICD-10-CM

## 2022-11-24 NOTE — Assessment & Plan Note (Addendum)
Stable mild elevated LDL 120s Last lipid 2023  Plan: No medication required at age 27, continue improving lifestyle diet weight management

## 2022-11-24 NOTE — Patient Instructions (Addendum)
Thank you for coming to the office today.  Labs today, pending results.  [facility] [place] upcoming scheduled for 12/2022, likely due for pap smear, as it has been 3 years  No vaccines due  Please schedule a Follow-up Appointment to: Return in about 1 year (around 11/24/2023) for 1 year Annual Physical AM fasting lab AFTER (thurs/Fri AM around 1040-11am).  If you have any other questions or concerns, please feel free to call the office or send a message through MyChart. You may also schedule an earlier appointment if necessary.  Additionally, you may be receiving a survey about your experience at our office within a few days to 1 week by e-mail or mail. We value your feedback.  Saralyn Pilar, DO [facility], [place]

## 2022-11-24 NOTE — Progress Notes (Signed)
Subjective:    Patient ID: Laurie Mendez, female    DOB: 10/11/1995, 27 y.o.   MRN: [id number]  Laurie Mendez is a 27 y.o. female presenting on 11/24/2022 for Annual Exam   HPI  Here for Annual Physical and due for fasting labs. Biometric for Physical / [employer] Employee    Obesity BMI >38 Weight down 1 lb in 1 year, stable overall Lifestyle Diet - balanced, meal prep daily Exercise - regularly 3 x week focusing on cardio   GERD Suspected GI IBS with predominant diarrhea type On Nexium 20mg  OTC mostly as needed, not taking every day. Can take for 1 week at a time or PRN. Avoids trigger foods. Since last year, has improved on OTC Peppermint Oil capsules remedy daily   Yearly with OBGYN / [facility] On Depo, phasing off, last shot Feb 2024 She will now be off of hormonal therapy and let her menstrual cycle reset naturally   History of Sinus infections, none recently   Health Maintenance:   UTD TDap 2016 UTD COVID Vaccine, booster HPV vaccine updated  Cervical CA Screening - [facility] [place] upcoming scheduled for 12/2022, likely due for pap smear, as it has been 3 years. Previously reported negative, will get copy of result, last pap was 10/2019 negative.      11/24/2022   10:53 AM 11/18/2021   10:50 AM 11/12/2020   10:46 AM  Depression screen PHQ 2/9  Decreased Interest 0 0 0  Down, Depressed, Hopeless 0 0 0  PHQ - 2 Score 0 0 0  Altered sleeping  0   Tired, decreased energy  0   Change in appetite  0   Feeling bad or failure about yourself   0   Trouble concentrating  0   Moving slowly or fidgety/restless  0   Suicidal thoughts  0   PHQ-9 Score  0   Difficult doing work/chores  Not difficult at all       11/24/2022   10:54 AM 11/18/2021   10:51 AM 11/12/2020   10:01 AM  GAD 7 : Generalized Anxiety Score  Nervous, Anxious, on Edge 0 0 1  Control/stop worrying 0 0 1  Worry too much - different things 0 0 1  Trouble relaxing 0 0 1   Restless 0 0 0  Easily annoyed or irritable 0 0 0  Afraid - awful might happen 0 0 0  Total GAD 7 Score 0 0 4  Anxiety Difficulty  Not difficult at all Not difficult at all      History reviewed. No pertinent past medical history. Past Surgical History:  Procedure Laterality Date   oral surgery  2016   wisdome teeth    Social History   Socioeconomic History   Marital status: Single    Spouse name: Not on file   Number of children: Not on file   Years of education: Not on file   Highest education level: Not on file  Occupational History   Not on file  Tobacco Use   Smoking status: Never   Smokeless tobacco: Never  Substance and Sexual Activity   Alcohol use: No   Drug use: No   Sexual activity: Not on file  Other Topics Concern   Not on file  Social History Narrative   X-ray tech   Social Determinants of Health   Financial Resource Strain: Not on file  Food Insecurity: Not on file  Transportation Needs: Not on  file  Physical Activity: Not on file  Stress: Not on file  Social Connections: Not on file  Intimate Partner Violence: Not on file   Family History  Problem Relation Age of Onset   Healthy Mother    Healthy Father    Healthy Sister    Healthy Sister    Atrial fibrillation Maternal Grandmother    Suicidality Maternal Grandfather    Depression Maternal Grandfather    Prostate cancer Maternal Grandfather    Diabetes Maternal Grandfather    Heart disease Paternal Grandfather    Cancer Neg Hx    Heart failure Neg Hx    Current Outpatient Medications on File Prior to Visit  Medication Sig   esomeprazole (NEXIUM) 20 MG capsule Take 20 mg by mouth daily at 12 noon.   Multiple Vitamin (MULTIVITAMIN) tablet Take 1 tablet by mouth daily.   No current facility-administered medications on file prior to visit.    Review of Systems  Constitutional:  Negative for activity change, appetite change, chills, diaphoresis, fatigue and fever.  HENT:  Negative  for congestion and hearing loss.   Eyes:  Negative for visual disturbance.  Respiratory:  Negative for cough, chest tightness, shortness of breath and wheezing.   Cardiovascular:  Negative for chest pain, palpitations and leg swelling.  Gastrointestinal:  Negative for abdominal pain, constipation, diarrhea, nausea and vomiting.  Genitourinary:  Negative for dysuria, frequency and hematuria.  Musculoskeletal:  Negative for arthralgias and neck pain.  Skin:  Negative for rash.  Neurological:  Negative for dizziness, weakness, light-headedness, numbness and headaches.  Hematological:  Negative for adenopathy.  Psychiatric/Behavioral:  Negative for behavioral problems, dysphoric mood and sleep disturbance.    Per HPI unless specifically indicated above      Objective:    BP 116/78   Pulse 96   Ht 5\' 5"  (1.651 m)   Wt 230 lb (104.3 kg)   SpO2 100%   BMI 38.27 kg/m   Wt Readings from Last 3 Encounters:  11/24/22 230 lb (104.3 kg)  11/18/21 230 lb 12.8 oz (104.7 kg)  11/12/20 232 lb 9.6 oz (105.5 kg)    Physical Exam Vitals and nursing note reviewed.  Constitutional:      General: She is not in acute distress.    Appearance: She is well-developed. She is obese. She is not diaphoretic.     Comments: Well-appearing, comfortable, cooperative  HENT:     Head: Normocephalic and atraumatic.     Right Ear: Ear canal and external ear normal. There is no impacted cerumen.     Left Ear: Tympanic membrane, ear canal and external ear normal. There is no impacted cerumen.     Ears:     Comments: RM scar tissue from prior tube Eyes:     General:        Right eye: No discharge.        Left eye: No discharge.     Conjunctiva/sclera: Conjunctivae normal.     Pupils: Pupils are equal, round, and reactive to light.  Neck:     Thyroid: No thyromegaly.     Vascular: No carotid bruit.  Cardiovascular:     Rate and Rhythm: Normal rate and regular rhythm.     Pulses: Normal pulses.     Heart  sounds: Normal heart sounds. No murmur heard. Pulmonary:     Effort: Pulmonary effort is normal. No respiratory distress.     Breath sounds: Normal breath sounds. No wheezing or rales.  Abdominal:  General: Bowel sounds are normal. There is no distension.     Palpations: Abdomen is soft. There is no mass.     Tenderness: There is no abdominal tenderness.  Musculoskeletal:        General: No tenderness. Normal range of motion.     Cervical back: Normal range of motion and neck supple.     Right lower leg: No edema.     Left lower leg: No edema.     Comments: Upper / Lower Extremities: - Normal muscle tone, strength bilateral upper extremities 5/5, lower extremities 5/5  Lymphadenopathy:     Cervical: No cervical adenopathy.  Skin:    General: Skin is warm and dry.     Findings: No erythema or rash.  Neurological:     Mental Status: She is alert and oriented to person, place, and time.     Comments: Distal sensation intact to light touch all extremities  Psychiatric:        Mood and Affect: Mood normal.        Behavior: Behavior normal.        Thought Content: Thought content normal.     Comments: Well groomed, good eye contact, normal speech and thoughts       Results for orders placed or performed in visit on 11/18/21  COMPLETE METABOLIC PANEL WITH GFR  Result Value Ref Range   Glucose, Bld 88 65 - 99 mg/dL   BUN 14 7 - 25 mg/dL   Creat 1.61 0.96 - 0.45 mg/dL   eGFR 409 > OR = 60 WJ/XBJ/4.78G9   BUN/Creatinine Ratio NOT APPLICABLE 6 - 22 (calc)   Sodium 141 135 - 146 mmol/L   Potassium 4.4 3.5 - 5.3 mmol/L   Chloride 105 98 - 110 mmol/L   CO2 24 20 - 32 mmol/L   Calcium 9.9 8.6 - 10.2 mg/dL   Total Protein 7.3 6.1 - 8.1 g/dL   Albumin 4.5 3.6 - 5.1 g/dL   Globulin 2.8 1.9 - 3.7 g/dL (calc)   AG Ratio 1.6 1.0 - 2.5 (calc)   Total Bilirubin 1.0 0.2 - 1.2 mg/dL   Alkaline phosphatase (APISO) 78 31 - 125 U/L   AST 18 10 - 30 U/L   ALT 27 6 - 29 U/L  CBC with  Differential/Platelet  Result Value Ref Range   WBC 8.4 3.8 - 10.8 Thousand/uL   RBC 4.65 3.80 - 5.10 Million/uL   Hemoglobin 14.7 11.7 - 15.5 g/dL   HCT 56.2 13.0 - 86.5 %   MCV 93.8 80.0 - 100.0 fL   MCH 31.6 27.0 - 33.0 pg   MCHC 33.7 32.0 - 36.0 g/dL   RDW 78.4 69.6 - 29.5 %   Platelets 401 (H) 140 - 400 Thousand/uL   MPV 10.0 7.5 - 12.5 fL   Neutro Abs 5,368 1,500 - 7,800 cells/uL   Lymphs Abs 2,209 850 - 3,900 cells/uL   Absolute Monocytes 580 200 - 950 cells/uL   Eosinophils Absolute 193 15 - 500 cells/uL   Basophils Absolute 50 0 - 200 cells/uL   Neutrophils Relative % 63.9 %   Total Lymphocyte 26.3 %   Monocytes Relative 6.9 %   Eosinophils Relative 2.3 %   Basophils Relative 0.6 %  Lipid panel  Result Value Ref Range   Cholesterol 195 <200 mg/dL   HDL 43 (L) > OR = 50 mg/dL   Triglycerides 284 (H) <150 mg/dL   LDL Cholesterol (Calc) 120 (H) mg/dL (calc)  Total CHOL/HDL Ratio 4.5 <5.0 (calc)   Non-HDL Cholesterol (Calc) 152 (H) <130 mg/dL (calc)  Hemoglobin B1Y  Result Value Ref Range   Hgb A1c MFr Bld 5.4 <5.7 % of total Hgb   Mean Plasma Glucose 108 mg/dL   eAG (mmol/L) 6.0 mmol/L  TSH  Result Value Ref Range   TSH 0.82 mIU/L      Assessment & Plan:   Problem List Items Addressed This Visit     Mixed hyperlipidemia    Stable mild elevated LDL 120s Last lipid 2023  Plan: No medication required at age 57, continue improving lifestyle diet weight management      Relevant Orders   COMPLETE METABOLIC PANEL WITH GFR   CBC with Differential/Platelet   Hemoglobin A1c   Lipid panel   TSH   Other Visit Diagnoses     Annual physical exam    -  Primary   Relevant Orders   COMPLETE METABOLIC PANEL WITH GFR   CBC with Differential/Platelet   Hemoglobin A1c   Lipid panel   TSH   Screening for diabetes mellitus (DM)       Relevant Orders   Hemoglobin A1c       Updated Health Maintenance information Fasting labs today, pending results Encouraged  improvement to lifestyle with diet and exercise Goal of weight loss [facility] [place] upcoming scheduled for 12/2022, likely due for pap smear, as it has been 3 years  Orders Placed This Encounter  Procedures   COMPLETE METABOLIC PANEL WITH GFR   CBC with Differential/Platelet   Hemoglobin A1c   Lipid panel    Order Specific Question:   Has the patient fasted?    Answer:   Yes   TSH     No orders of the defined types were placed in this encounter.     Follow up plan: Return in about 1 year (around 11/24/2023) for 1 year Annual Physical AM fasting lab AFTER (thurs/Fri AM around 1040-11am).  Saralyn Pilar, DO [facility] 11/24/2022, 10:58 AM

## 2022-11-25 LAB — CBC WITH DIFFERENTIAL/PLATELET
Absolute Monocytes: 588 cells/uL (ref 200–950)
Basophils Absolute: 20 cells/uL (ref 0–200)
Basophils Relative: 0.5 %
Eosinophils Absolute: 88 cells/uL (ref 15–500)
Eosinophils Relative: 2.2 %
HCT: 44.2 % (ref 35.0–45.0)
Hemoglobin: 15 g/dL (ref 11.7–15.5)
Lymphs Abs: 1760 cells/uL (ref 850–3900)
MCH: 30.7 pg (ref 27.0–33.0)
MCHC: 33.9 g/dL (ref 32.0–36.0)
MCV: 90.4 fL (ref 80.0–100.0)
MPV: 9.8 fL (ref 7.5–12.5)
Monocytes Relative: 14.7 %
Neutro Abs: 1544 cells/uL (ref 1500–7800)
Neutrophils Relative %: 38.6 %
Platelets: 375 10*3/uL (ref 140–400)
RBC: 4.89 10*6/uL (ref 3.80–5.10)
RDW: 12.3 % (ref 11.0–15.0)
Total Lymphocyte: 44 %
WBC: 4 10*3/uL (ref 3.8–10.8)

## 2022-11-25 LAB — COMPLETE METABOLIC PANEL WITH GFR
AG Ratio: 1.7 (calc) (ref 1.0–2.5)
ALT: 37 U/L — ABNORMAL HIGH (ref 6–29)
AST: 24 U/L (ref 10–30)
Albumin: 4.5 g/dL (ref 3.6–5.1)
Alkaline phosphatase (APISO): 80 U/L (ref 31–125)
BUN: 10 mg/dL (ref 7–25)
CO2: 24 mmol/L (ref 20–32)
Calcium: 10 mg/dL (ref 8.6–10.2)
Chloride: 105 mmol/L (ref 98–110)
Creat: 0.64 mg/dL (ref 0.50–0.96)
Globulin: 2.6 g/dL (calc) (ref 1.9–3.7)
Glucose, Bld: 92 mg/dL (ref 65–99)
Potassium: 4.2 mmol/L (ref 3.5–5.3)
Sodium: 140 mmol/L (ref 135–146)
Total Bilirubin: 0.8 mg/dL (ref 0.2–1.2)
Total Protein: 7.1 g/dL (ref 6.1–8.1)
eGFR: 124 mL/min/{1.73_m2} (ref >=60)

## 2022-11-25 LAB — LIPID PANEL
Cholesterol: 183 mg/dL (ref <200)
HDL: 42 mg/dL — ABNORMAL LOW (ref >=50)
LDL Cholesterol (Calc): 116 mg/dL (calc) — ABNORMAL HIGH
Non-HDL Cholesterol (Calc): 141 mg/dL (calc) — ABNORMAL HIGH (ref <130)
Total CHOL/HDL Ratio: 4.4 (calc) (ref <5.0)
Triglycerides: 137 mg/dL (ref <150)

## 2022-11-25 LAB — HEMOGLOBIN A1C
Hgb A1c MFr Bld: 5.6 % of total Hgb (ref <5.7)
Mean Plasma Glucose: 114 mg/dL
eAG (mmol/L): 6.3 mmol/L

## 2022-11-25 LAB — TSH: TSH: 1.28 mIU/L

## 2022-12-15 DIAGNOSIS — Z6839 Body mass index (BMI) 39.0-39.9, adult: Secondary | ICD-10-CM | POA: Diagnosis not present

## 2022-12-15 DIAGNOSIS — Z01419 Encounter for gynecological examination (general) (routine) without abnormal findings: Secondary | ICD-10-CM | POA: Diagnosis not present

## 2022-12-16 LAB — HM PAP SMEAR

## 2023-10-10 ENCOUNTER — Telehealth: Admitting: Physician Assistant

## 2023-10-10 ENCOUNTER — Other Ambulatory Visit: Payer: Self-pay

## 2023-10-10 DIAGNOSIS — B9689 Other specified bacterial agents as the cause of diseases classified elsewhere: Secondary | ICD-10-CM

## 2023-10-10 DIAGNOSIS — J019 Acute sinusitis, unspecified: Secondary | ICD-10-CM

## 2023-10-10 MED ORDER — DOXYCYCLINE HYCLATE 100 MG PO TABS
100.0000 mg | ORAL_TABLET | Freq: Two times a day (BID) | ORAL | 0 refills | Status: DC
  Filled 2023-10-10: qty 20, 10d supply, fill #0

## 2023-10-10 NOTE — Progress Notes (Signed)

## 2023-10-10 NOTE — Progress Notes (Signed)
 I have spent 5 minutes in review of e-visit questionnaire, review and updating patient chart, medical decision making and response to patient.   Piedad Climes, PA-C

## 2023-12-01 ENCOUNTER — Encounter: Payer: Self-pay | Admitting: Family Medicine

## 2023-12-21 DIAGNOSIS — Z6838 Body mass index (BMI) 38.0-38.9, adult: Secondary | ICD-10-CM | POA: Diagnosis not present

## 2023-12-21 DIAGNOSIS — Z01419 Encounter for gynecological examination (general) (routine) without abnormal findings: Secondary | ICD-10-CM | POA: Diagnosis not present

## 2023-12-28 ENCOUNTER — Encounter: Payer: Self-pay | Admitting: Family Medicine

## 2023-12-28 ENCOUNTER — Ambulatory Visit (INDEPENDENT_AMBULATORY_CARE_PROVIDER_SITE_OTHER): Admitting: Family Medicine

## 2023-12-28 VITALS — BP 120/78 | HR 90 | Ht 65.0 in | Wt 228.2 lb

## 2023-12-28 DIAGNOSIS — E669 Obesity, unspecified: Secondary | ICD-10-CM | POA: Diagnosis not present

## 2023-12-28 DIAGNOSIS — Z131 Encounter for screening for diabetes mellitus: Secondary | ICD-10-CM | POA: Diagnosis not present

## 2023-12-28 DIAGNOSIS — E782 Mixed hyperlipidemia: Secondary | ICD-10-CM

## 2023-12-28 DIAGNOSIS — Z0000 Encounter for general adult medical examination without abnormal findings: Secondary | ICD-10-CM

## 2023-12-28 NOTE — Patient Instructions (Addendum)
 Thank you for coming to the office today.  Labs today  Improve water intake  DUE for FASTING BLOOD WORK (no food or drink after midnight before the lab appointment, only water or coffee without cream/sugar on the morning of)  SCHEDULE Lab Only visit in the morning at the clinic for lab draw in 6 MONTHS   - Make sure Lab Only appointment is at about 1 week before your next appointment, so that results will be available  For Lab Results, once available within 2-3 days of blood draw, you can can log in to MyChart online to view your results and a brief explanation. Also, we can discuss results at next follow-up visit.  Please schedule a Follow-up Appointment to: Return in about 1 year (around 12/27/2024) for 1 year Annual Physical Thurs/Fri AM, labs AFTER.  If you have any other questions or concerns, please feel free to call the office or send a message through MyChart. You may also schedule an earlier appointment if necessary.  Additionally, you may be receiving a survey about your experience at our office within a few days to 1 week by e-mail or mail. We value your feedback.  Domingo Friend, DO [facility], [place]

## 2023-12-28 NOTE — Progress Notes (Signed)
 Subjective:    Patient ID: Laurie Mendez, female    DOB: [birth year], 28 y.o.   MRN: [id number]  Laurie Mendez is a 28 y.o. female presenting on 12/28/2023 for Annual Exam   HPI  Discussed the use of AI scribe software for clinical note transcription with the patient, who gave verbal consent to proceed.  History of Present Illness   Laurie Mendez is a 28 year old female who presents for an annual physical exam.   Here for Annual Physical and due for fasting labs. Biometric for Physical / [employer] Employee    Obesity BMI >37 Weight stable in 1 year. Lifestyle Diet - balanced, meal prep daily She acknowledges that her water intake may be insufficient due to her busy work schedule. Exercise - regularly 3 x week focusing on cardio   GERD Suspected GI IBS with predominant diarrhea type On Nexium 20mg  OTC mostly as needed, not taking every day. Can take for 1 week at a time or PRN. Avoids trigger foods. Since last year, has improved on OTC Peppermint Oil capsules remedy daily   Mild Elevated Hemoglobin Recently at [facility] Her hemoglobin was slightly elevated at 15.2 g/dL, and she is fasting today for blood work.   Health Maintenance:    Cervical CA Screening - [facility] 12/2023 completed, reported normal. Request copy of record / results.      12/28/2023   11:19 AM 11/24/2022   10:53 AM 11/18/2021   10:50 AM  Depression screen PHQ 2/9  Decreased Interest 0 0 0  Down, Depressed, Hopeless 0 0 0  PHQ - 2 Score 0 0 0  Altered sleeping 0  0  Tired, decreased energy 0  0  Change in appetite 0  0  Feeling bad or failure about yourself  0  0  Trouble concentrating 0  0  Moving slowly or fidgety/restless 0  0  Suicidal thoughts 0  0  PHQ-9 Score 0  0  Difficult doing work/chores   Not difficult at all       12/28/2023   11:19 AM 11/24/2022   10:54 AM 11/18/2021   10:51 AM 11/12/2020   10:01 AM  GAD 7 : Generalized Anxiety Score  Nervous, Anxious, on  Edge 1 0 0 1  Control/stop worrying 1 0 0 1  Worry too much - different things 1 0 0 1  Trouble relaxing 1 0 0 1  Restless 0 0 0 0  Easily annoyed or irritable 1 0 0 0  Afraid - awful might happen 0 0 0 0  Total GAD 7 Score 5 0 0 4  Anxiety Difficulty Not difficult at all  Not difficult at all Not difficult at all     History reviewed. No pertinent past medical history. Past Surgical History:  Procedure Laterality Date   oral surgery  2016   wisdome teeth    Social History   Socioeconomic History   Marital status: Single    Spouse name: Not on file   Number of children: Not on file   Years of education: Not on file   Highest education level: Associate degree: academic program  Occupational History   Not on file  Tobacco Use   Smoking status: Never   Smokeless tobacco: Never  Substance and Sexual Activity   Alcohol use: No   Drug use: No   Sexual activity: Not on file  Other Topics Concern   Not on file  Social History  Narrative   X-ray tech   Social Drivers of Health   Financial Resource Strain: Low Risk  (12/27/2023)   Overall Financial Resource Strain (CARDIA)    Difficulty of Paying Living Expenses: Not very hard  Food Insecurity: No Food Insecurity (12/27/2023)   Hunger Vital Sign    Worried About Running Out of Food in the Last Year: Never true    Ran Out of Food in the Last Year: Never true  Transportation Needs: No Transportation Needs (12/27/2023)   PRAPARE - Administrator, Civil Service (Medical): No    Lack of Transportation (Non-Medical): No  Physical Activity: Sufficiently Active (12/27/2023)   Exercise Vital Sign    Days of Exercise per Week: 6 days    Minutes of Exercise per Session: 150+ min  Stress: No Stress Concern Present (12/27/2023)   [employer] of Occupational Health - Occupational Stress Questionnaire    Feeling of Stress: Only a little  Social Connections: Moderately Isolated (12/27/2023)   Social Connection and  Isolation Panel    Frequency of Communication with Friends and Family: More than three times a week    Frequency of Social Gatherings with Friends and Family: More than three times a week    Attends Religious Services: More than 4 times per year    Active Member of [employer] or Organizations: No    Attends Engineer, structural: Not on file    Marital Status: Never married  Catering manager Violence: Not on file   Family History  Problem Relation Age of Onset   Healthy Mother    Healthy Father    Healthy Sister    Healthy Sister    Atrial fibrillation Maternal Grandmother    Suicidality Maternal Grandfather    Depression Maternal Grandfather    Prostate cancer Maternal Grandfather    Diabetes Maternal Grandfather    Heart disease Paternal Grandfather    Cancer Neg Hx    Heart failure Neg Hx    Current Outpatient Medications on File Prior to Visit  Medication Sig   cholecalciferol (VITAMIN D3) 25 MCG (1000 UNIT) tablet Take 1,000 Units by mouth daily.   esomeprazole (NEXIUM) 20 MG capsule Take 20 mg by mouth daily at 12 noon.   Multiple Vitamin (MULTIVITAMIN) tablet Take 1 tablet by mouth daily.   Peppermint Oil 50 MG CAPS    No current facility-administered medications on file prior to visit.    Review of Systems  Constitutional:  Negative for activity change, appetite change, chills, diaphoresis, fatigue and fever.  HENT:  Negative for congestion and hearing loss.   Eyes:  Negative for visual disturbance.  Respiratory:  Negative for cough, chest tightness, shortness of breath and wheezing.   Cardiovascular:  Negative for chest pain, palpitations and leg swelling.  Gastrointestinal:  Negative for abdominal pain, constipation, diarrhea, nausea and vomiting.  Genitourinary:  Negative for dysuria, frequency and hematuria.  Musculoskeletal:  Negative for arthralgias and neck pain.  Skin:  Negative for rash.  Neurological:  Negative for dizziness, weakness,  light-headedness, numbness and headaches.  Hematological:  Negative for adenopathy.  Psychiatric/Behavioral:  Negative for behavioral problems, dysphoric mood and sleep disturbance.    Per HPI unless specifically indicated above     Objective:    BP 120/78 (BP Location: Right Arm, Patient Position: Sitting, Cuff Size: Normal)   Pulse 90   Ht 5' 5 (1.651 m)   Wt 228 lb 4 oz (103.5 kg)   SpO2 96%   BMI  37.98 kg/m   Wt Readings from Last 3 Encounters:  12/28/23 228 lb 4 oz (103.5 kg)  11/24/22 230 lb (104.3 kg)  11/18/21 230 lb 12.8 oz (104.7 kg)    Physical Exam Vitals and nursing note reviewed.  Constitutional:      General: She is not in acute distress.    Appearance: She is well-developed. She is obese. She is not diaphoretic.     Comments: Well-appearing, comfortable, cooperative  HENT:     Head: Normocephalic and atraumatic.     Right Ear: Tympanic membrane, ear canal and external ear normal. There is no impacted cerumen.     Left Ear: Tympanic membrane, ear canal and external ear normal. There is no impacted cerumen.   Eyes:     General:        Right eye: No discharge.        Left eye: No discharge.     Conjunctiva/sclera: Conjunctivae normal.     Pupils: Pupils are equal, round, and reactive to light.   Neck:     Thyroid: No thyromegaly.     Vascular: No carotid bruit.   Cardiovascular:     Rate and Rhythm: Normal rate and regular rhythm.     Pulses: Normal pulses.     Heart sounds: Normal heart sounds. No murmur heard. Pulmonary:     Effort: Pulmonary effort is normal. No respiratory distress.     Breath sounds: Normal breath sounds. No wheezing or rales.  Abdominal:     General: Bowel sounds are normal. There is no distension.     Palpations: Abdomen is soft. There is no mass.     Tenderness: There is no abdominal tenderness.   Musculoskeletal:        General: No tenderness. Normal range of motion.     Cervical back: Normal range of motion and neck  supple.     Right lower leg: No edema.     Left lower leg: No edema.     Comments: Upper / Lower Extremities: - Normal muscle tone, strength bilateral upper extremities 5/5, lower extremities 5/5  Lymphadenopathy:     Cervical: No cervical adenopathy.   Skin:    General: Skin is warm and dry.     Findings: No erythema or rash.   Neurological:     Mental Status: She is alert and oriented to person, place, and time.     Comments: Distal sensation intact to light touch all extremities  Psychiatric:        Mood and Affect: Mood normal.        Behavior: Behavior normal.        Thought Content: Thought content normal.     Comments: Well groomed, good eye contact, normal speech and thoughts     Results for orders placed or performed in visit on 11/24/22  COMPLETE METABOLIC PANEL WITH GFR   Collection Time: 11/24/22 11:06 AM  Result Value Ref Range   Glucose, Bld 92 65 - 99 mg/dL   BUN 10 7 - 25 mg/dL   Creat 1.61 0.96 - 0.45 mg/dL   eGFR 409 > OR = 60 WJ/XBJ/4.78G9   BUN/Creatinine Ratio SEE NOTE: 6 - 22 (calc)   Sodium 140 135 - 146 mmol/L   Potassium 4.2 3.5 - 5.3 mmol/L   Chloride 105 98 - 110 mmol/L   CO2 24 20 - 32 mmol/L   Calcium 10.0 8.6 - 10.2 mg/dL   Total Protein 7.1 6.1 - 8.1 g/dL  Albumin 4.5 3.6 - 5.1 g/dL   Globulin 2.6 1.9 - 3.7 g/dL (calc)   AG Ratio 1.7 1.0 - 2.5 (calc)   Total Bilirubin 0.8 0.2 - 1.2 mg/dL   Alkaline phosphatase (APISO) 80 31 - 125 U/L   AST 24 10 - 30 U/L   ALT 37 (H) 6 - 29 U/L  CBC with Differential/Platelet   Collection Time: 11/24/22 11:06 AM  Result Value Ref Range   WBC 4.0 3.8 - 10.8 Thousand/uL   RBC 4.89 3.80 - 5.10 Million/uL   Hemoglobin 15.0 11.7 - 15.5 g/dL   HCT 16.1 09.6 - 04.5 %   MCV 90.4 80.0 - 100.0 fL   MCH 30.7 27.0 - 33.0 pg   MCHC 33.9 32.0 - 36.0 g/dL   RDW 40.9 81.1 - 91.4 %   Platelets 375 140 - 400 Thousand/uL   MPV 9.8 7.5 - 12.5 fL   Neutro Abs 1,544 1,500 - 7,800 cells/uL   Lymphs Abs 1,760 850 -  3,900 cells/uL   Absolute Monocytes 588 200 - 950 cells/uL   Eosinophils Absolute 88 15 - 500 cells/uL   Basophils Absolute 20 0 - 200 cells/uL   Neutrophils Relative % 38.6 %   Total Lymphocyte 44.0 %   Monocytes Relative 14.7 %   Eosinophils Relative 2.2 %   Basophils Relative 0.5 %  Hemoglobin A1c   Collection Time: 11/24/22 11:06 AM  Result Value Ref Range   Hgb A1c MFr Bld 5.6 <5.7 % of total Hgb   Mean Plasma Glucose 114 mg/dL   eAG (mmol/L) 6.3 mmol/L  Lipid panel   Collection Time: 11/24/22 11:06 AM  Result Value Ref Range   Cholesterol 183 <200 mg/dL   HDL 42 (L) > OR = 50 mg/dL   Triglycerides 782 <956 mg/dL   LDL Cholesterol (Calc) 116 (H) mg/dL (calc)   Total CHOL/HDL Ratio 4.4 <5.0 (calc)   Non-HDL Cholesterol (Calc) 141 (H) <130 mg/dL (calc)  TSH   Collection Time: 11/24/22 11:06 AM  Result Value Ref Range   TSH 1.28 mIU/L      Assessment & Plan:   Problem List Items Addressed This Visit     Mixed hyperlipidemia   Relevant Orders   TSH   Lipid panel   Comprehensive metabolic panel with GFR   Other Visit Diagnoses       Annual physical exam    -  Primary   Relevant Orders   TSH   Lipid panel   Hemoglobin A1c   CBC with Differential/Platelet   Comprehensive metabolic panel with GFR     Screening for diabetes mellitus (DM)       Relevant Orders   Hemoglobin A1c     Obesity (BMI 35.0-39.9 without comorbidity)       Relevant Orders   TSH   Hemoglobin A1c   Comprehensive metabolic panel with GFR        Updated Health Maintenance information Fasting labs ordered for today, results pending. Encouraged improvement to lifestyle with diet and exercise Goal of weight loss  General Health Maintenance Routine physical for 28 year old female. Vital signs normal. Weight stable from last year. Normal Pap smear reported from 12/2023 request copy of record.   Slightly elevated hemoglobin at 15.2 g/dL, consistent with past results. Dehydration possible  cause, reviewed potential sleep apnea however she does not endorse this.  - Perform comprehensive blood work: thyroid, cholesterol, glucose, CBC, chemistry panel. - Encourage increased water intake. - Schedule next annual physical  in one year. - Monitor hemoglobin levels; consider blood donation if elevated still. - Provide guidance on weight management discussion - she will continue to improve lifestyle diet and exercise. No further referrals or treatments required at this time. - Encourage cardiovascular exercise and meal preparation.         Orders Placed This Encounter  Procedures   TSH   Lipid panel    Has the patient fasted?:   Yes   Hemoglobin A1c   CBC with Differential/Platelet   Comprehensive metabolic panel with GFR    Has the patient fasted?:   Yes    No orders of the defined types were placed in this encounter.    Follow up plan: Return in about 1 year (around 12/27/2024) for 1 year Annual Physical Thurs/Fri AM, labs AFTER.  Domingo Friend, DO [facility] 12/28/2023, 11:32 AM

## 2023-12-29 LAB — COMPREHENSIVE METABOLIC PANEL WITH GFR
AG Ratio: 1.8 (calc) (ref 1.0–2.5)
ALT: 30 U/L — ABNORMAL HIGH (ref 6–29)
AST: 20 U/L (ref 10–30)
Albumin: 4.4 g/dL (ref 3.6–5.1)
Alkaline phosphatase (APISO): 61 U/L (ref 31–125)
BUN: 11 mg/dL (ref 7–25)
CO2: 29 mmol/L (ref 20–32)
Calcium: 9.7 mg/dL (ref 8.6–10.2)
Chloride: 102 mmol/L (ref 98–110)
Creat: 0.73 mg/dL (ref 0.50–0.96)
Globulin: 2.5 g/dL (ref 1.9–3.7)
Glucose, Bld: 87 mg/dL (ref 65–99)
Potassium: 4.3 mmol/L (ref 3.5–5.3)
Sodium: 138 mmol/L (ref 135–146)
Total Bilirubin: 1.5 mg/dL — ABNORMAL HIGH (ref 0.2–1.2)
Total Protein: 6.9 g/dL (ref 6.1–8.1)
eGFR: 115 mL/min/{1.73_m2} (ref >=60)

## 2023-12-29 LAB — TSH: TSH: 0.7 m[IU]/L

## 2023-12-29 LAB — CBC WITH DIFFERENTIAL/PLATELET
Absolute Lymphocytes: 1972 {cells}/uL (ref 850–3900)
Absolute Monocytes: 415 {cells}/uL (ref 200–950)
Basophils Absolute: 19 {cells}/uL (ref 0–200)
Basophils Relative: 0.3 %
Eosinophils Absolute: 118 {cells}/uL (ref 15–500)
Eosinophils Relative: 1.9 %
HCT: 42.9 % (ref 35.0–45.0)
Hemoglobin: 14.2 g/dL (ref 11.7–15.5)
MCH: 30.9 pg (ref 27.0–33.0)
MCHC: 33.1 g/dL (ref 32.0–36.0)
MCV: 93.3 fL (ref 80.0–100.0)
MPV: 9.6 fL (ref 7.5–12.5)
Monocytes Relative: 6.7 %
Neutro Abs: 3677 {cells}/uL (ref 1500–7800)
Neutrophils Relative %: 59.3 %
Platelets: 365 10*3/uL (ref 140–400)
RBC: 4.6 10*6/uL (ref 3.80–5.10)
RDW: 12.5 % (ref 11.0–15.0)
Total Lymphocyte: 31.8 %
WBC: 6.2 10*3/uL (ref 3.8–10.8)

## 2023-12-29 LAB — LIPID PANEL
Cholesterol: 222 mg/dL — ABNORMAL HIGH (ref <200)
HDL: 50 mg/dL (ref >=50)
LDL Cholesterol (Calc): 138 mg/dL — ABNORMAL HIGH
Non-HDL Cholesterol (Calc): 172 mg/dL — ABNORMAL HIGH (ref <130)
Total CHOL/HDL Ratio: 4.4 (calc) (ref <5.0)
Triglycerides: 195 mg/dL — ABNORMAL HIGH (ref <150)

## 2023-12-29 LAB — HEMOGLOBIN A1C
Hgb A1c MFr Bld: 5.5 % (ref <5.7)
Mean Plasma Glucose: 111 mg/dL
eAG (mmol/L): 6.2 mmol/L

## 2024-01-04 ENCOUNTER — Ambulatory Visit: Payer: Self-pay | Admitting: Family Medicine

## 2024-03-01 ENCOUNTER — Ambulatory Visit (INDEPENDENT_AMBULATORY_CARE_PROVIDER_SITE_OTHER)

## 2024-03-01 ENCOUNTER — Ambulatory Visit

## 2024-03-01 ENCOUNTER — Ambulatory Visit
Admission: EM | Admit: 2024-03-01 | Discharge: 2024-03-01 | Disposition: A | Discharge status: Home / Self Care | Source: Home / Self Care | Attending: Family Medicine | Admitting: Family Medicine

## 2024-03-01 ENCOUNTER — Other Ambulatory Visit: Payer: Self-pay

## 2024-03-01 ENCOUNTER — Emergency Department: Admission: EM | Admit: 2024-03-01 | Discharge: 2024-03-01 | Disposition: A | Discharge status: Home / Self Care

## 2024-03-01 DIAGNOSIS — R109 Unspecified abdominal pain: Secondary | ICD-10-CM

## 2024-03-01 DIAGNOSIS — R3129 Other microscopic hematuria: Secondary | ICD-10-CM | POA: Diagnosis not present

## 2024-03-01 DIAGNOSIS — N201 Calculus of ureter: Secondary | ICD-10-CM

## 2024-03-01 DIAGNOSIS — N1330 Unspecified hydronephrosis: Secondary | ICD-10-CM | POA: Diagnosis not present

## 2024-03-01 DIAGNOSIS — N132 Hydronephrosis with renal and ureteral calculous obstruction: Secondary | ICD-10-CM | POA: Diagnosis not present

## 2024-03-01 LAB — BASIC METABOLIC PANEL WITH GFR
Anion gap: 12 (ref 5–15)
BUN: 9 mg/dL (ref 6–20)
CO2: 22 mmol/L (ref 22–32)
Calcium: 9.3 mg/dL (ref 8.9–10.3)
Chloride: 101 mmol/L (ref 98–111)
Creatinine, Ser: 0.69 mg/dL (ref 0.44–1.00)
GFR, Estimated: >60 mL/min (ref >60)
Glucose, Bld: 111 mg/dL — ABNORMAL HIGH (ref 70–99)
Potassium: 3.9 mmol/L (ref 3.5–5.1)
Sodium: 135 mmol/L (ref 135–145)

## 2024-03-01 LAB — URINALYSIS, W/ REFLEX TO CULTURE (INFECTION SUSPECTED)
Bilirubin Urine: NEGATIVE
Glucose, UA: NEGATIVE mg/dL
Ketones, ur: NEGATIVE mg/dL
Leukocytes,Ua: NEGATIVE
Nitrite: NEGATIVE
Protein, ur: NEGATIVE mg/dL
Specific Gravity, Urine: 1.02 (ref 1.005–1.030)
pH: 7 (ref 5.0–8.0)

## 2024-03-01 LAB — CBC WITH DIFFERENTIAL/PLATELET
Abs Immature Granulocytes: 0.03 K/uL (ref 0.00–0.07)
Basophils Absolute: 0 K/uL (ref 0.0–0.1)
Basophils Relative: 0 %
Eosinophils Absolute: 0.1 K/uL (ref 0.0–0.5)
Eosinophils Relative: 1 %
HCT: 41.3 % (ref 36.0–46.0)
Hemoglobin: 14.6 g/dL (ref 12.0–15.0)
Immature Granulocytes: 0 %
Lymphocytes Relative: 21 %
Lymphs Abs: 1.9 K/uL (ref 0.7–4.0)
MCH: 31.1 pg (ref 26.0–34.0)
MCHC: 35.4 g/dL (ref 30.0–36.0)
MCV: 87.9 fL (ref 80.0–100.0)
Monocytes Absolute: 0.4 K/uL (ref 0.1–1.0)
Monocytes Relative: 5 %
Neutro Abs: 6.8 K/uL (ref 1.7–7.7)
Neutrophils Relative %: 73 %
Platelets: 384 K/uL (ref 150–400)
RBC: 4.7 MIL/uL (ref 3.87–5.11)
RDW: 12.1 % (ref 11.5–15.5)
WBC: 9.2 K/uL (ref 4.0–10.5)
nRBC: 0 % (ref 0.0–0.2)

## 2024-03-01 LAB — PREGNANCY, URINE: Preg Test, Ur: NEGATIVE

## 2024-03-01 MED ORDER — OXYCODONE-ACETAMINOPHEN 5-325 MG PO TABS: 1.0000 | ORAL_TABLET | Freq: Three times a day (TID) | ORAL | 0 refills | Status: AC | PRN

## 2024-03-01 MED ORDER — TAMSULOSIN HCL 0.4 MG PO CAPS
0.4000 mg | ORAL_CAPSULE | Freq: Once | ORAL | Status: AC
  Administered 2024-03-01: 0.4 mg via ORAL
  Filled 2024-03-01: qty 1

## 2024-03-01 MED ORDER — SODIUM CHLORIDE 0.9 % IV BOLUS
1000.0000 mL | Freq: Once | INTRAVENOUS | Status: AC
  Administered 2024-03-01: 1000 mL via INTRAVENOUS

## 2024-03-01 MED ORDER — KETOROLAC TROMETHAMINE 60 MG/2ML IM SOLN
30.0000 mg | Freq: Once | INTRAMUSCULAR | Status: AC
  Administered 2024-03-01: 30 mg via INTRAMUSCULAR

## 2024-03-01 MED ORDER — ONDANSETRON HCL 4 MG/2ML IJ SOLN
4.0000 mg | Freq: Once | INTRAMUSCULAR | Status: AC
  Administered 2024-03-01: 4 mg via INTRAVENOUS
  Filled 2024-03-01: qty 2

## 2024-03-01 MED ORDER — KETOROLAC TROMETHAMINE 30 MG/ML IJ SOLN
30.0000 mg | Freq: Once | INTRAMUSCULAR | Status: AC
  Administered 2024-03-01: 30 mg via INTRAVENOUS
  Filled 2024-03-01: qty 1

## 2024-03-01 MED ORDER — TAMSULOSIN HCL 0.4 MG PO CAPS: 0.4000 mg | ORAL_CAPSULE | Freq: Every day | ORAL | 0 refills | Status: AC

## 2024-03-01 MED ORDER — ONDANSETRON 4 MG PO TBDP: 4.0000 mg | ORAL_TABLET | Freq: Three times a day (TID) | ORAL | 0 refills | Status: DC | PRN

## 2024-03-01 NOTE — ED Provider Notes (Signed)
 [facility]    CSN: [id number] Arrival date & time: 03/01/24  1040      History   Chief Complaint Chief Complaint  Patient presents with   Flank Pain    HPI Laurie Mendez is a 28 y.o. female.   HPI  Laurie Mendez presents for constant left sided flank pain with nausea since 5 AM. Took Tylenol  without relief. She has never had this kind of pain before. Pain described as like a knife.  Rated 6/10.  Pain radiates up and down her back and around her side. Denies fever, dysuria, hematuria, vaginal discharge, vomiting, diarrhea, constipation and abdominal pain. Says she is not sexually active. Patient's last menstrual period was 02/16/2024.  No history of kidney stones. Walking and sitting upright makes the pain worse. Pain improves when she lays on the left side and leaning over.  Had dull pain on the right side earlier this week. This pain went away the next day.      History reviewed. No pertinent past medical history.  Patient Active Problem List   Diagnosis Date Noted   Mixed hyperlipidemia 11/18/2021   STREPTOCOCCAL PHARYNGITIS 09/11/2008   KNEE PAIN, LEFT 02/20/2008   URINARY URGENCY 03/15/2007    Past Surgical History:  Procedure Laterality Date   oral surgery  2016   wisdome teeth     OB History   No obstetric history on file.      Home Medications    Prior to Admission medications   Medication Sig Start Date End Date Taking? Authorizing Provider  cholecalciferol (VITAMIN D3) 25 MCG (1000 UNIT) tablet Take 1,000 Units by mouth daily.   Yes [provider]  esomeprazole (NEXIUM) 20 MG capsule Take 20 mg by mouth daily at 12 noon.   Yes [provider]  Multiple Vitamin (MULTIVITAMIN) tablet Take 1 tablet by mouth daily.   Yes [provider]  Peppermint Oil 50 MG CAPS  11/09/22  Yes [provider]  ondansetron  (ZOFRAN -ODT) 4 MG disintegrating tablet Take 1 tablet (4 mg total) by mouth every 8 (eight) hours as needed  for nausea or vomiting. 03/01/24   Menshew, Candida LULLA Kings, PA-C  oxyCODONE -acetaminophen  (PERCOCET) 5-325 MG tablet Take 1 tablet by mouth every 8 (eight) hours as needed for up to 3 days. 03/01/24 03/04/24  Menshew, Candida LULLA Kings, PA-C  tamsulosin  (FLOMAX ) 0.4 MG CAPS capsule Take 1 capsule (0.4 mg total) by mouth daily after supper for 10 days. 03/01/24 03/11/24  Menshew, Candida LULLA Kings, PA-C    Family History Family History  Problem Relation Age of Onset   Healthy Mother    Healthy Father    Healthy Sister    Healthy Sister    Atrial fibrillation Maternal Grandmother    Suicidality Maternal Grandfather    Depression Maternal Grandfather    Prostate cancer Maternal Grandfather    Diabetes Maternal Grandfather    Heart disease Paternal Grandfather    Cancer Neg Hx    Heart failure Neg Hx     Social History Social History   Tobacco Use   Smoking status: Never   Smokeless tobacco: Never  Vaping Use   Vaping status: Never Used  Substance Use Topics   Alcohol use: No   Drug use: No     Allergies   Penicillins   Review of Systems Review of Systems :negative unless otherwise stated in HPI.      Physical Exam Triage Vital Signs ED Triage Vitals  Encounter Vitals Group  BP 03/01/24 1051 (!) 135/102     Girls Systolic BP Percentile --      Girls Diastolic BP Percentile --      Boys Systolic BP Percentile --      Boys Diastolic BP Percentile --      Pulse Rate 03/01/24 1051 (!) 103     Resp 03/01/24 1051 18     Temp 03/01/24 1051 98.5 F (36.9 C)     Temp Source 03/01/24 1051 Oral     SpO2 03/01/24 1051 99 %     Weight 03/01/24 1050 228 lb (103.4 kg)     Height 03/01/24 1050 5' 4 (1.626 m)     Head Circumference --      Peak Flow --      Pain Score 03/01/24 1050 5     Pain Loc --      Pain Education --      Exclude from Growth Chart --    No data found.  Updated Vital Signs BP (!) 135/102 (BP Location: Left Arm)   Pulse (!) 103   Temp 98.5 F (36.9 C)  (Oral)   Resp 18   Ht 5' 4 (1.626 m)   Wt 103.4 kg   LMP 02/16/2024   SpO2 99%   BMI 39.14 kg/m   Visual Acuity Right Eye Distance:   Left Eye Distance:   Bilateral Distance:    Right Eye Near:   Left Eye Near:    Bilateral Near:     Physical Exam  GEN: uncomfortable appearing female, rocking back and forth on exam table  CV: regular rate and rhythm RESP: no increased work of breathing, clear to ascultation bilaterally ABD: Bowel sounds present. Soft, non-tender, non-distended.  No guarding, no rebound, no appreciable hepatosplenomegaly, + left CVA tenderness, negative McBurney's, negative Murphy MSK: no extremity edema SKIN: warm, dry, no rash on visible skin NEURO: alert, moves all extremities appropriately PSYCH: Normal affect, appropriate speech and behavior   UC Treatments / Results  Labs (all labs ordered are listed, but only abnormal results are displayed) Labs Reviewed  URINALYSIS, W/ REFLEX TO CULTURE (INFECTION SUSPECTED) - Abnormal; Notable for the following components:      Result Value   APPearance HAZY (*)    Hgb urine dipstick MODERATE (*)    Bacteria, UA FEW (*)    All other components within normal limits  BASIC METABOLIC PANEL WITH GFR - Abnormal; Notable for the following components:   Glucose, Bld 111 (*)    All other components within normal limits  CBC WITH DIFFERENTIAL/PLATELET  PREGNANCY, URINE    EKG  If EKG performed, see my interpretation and MDM section  Radiology CT Renal Stone Study Result Date: 03/01/2024 CLINICAL DATA:  Left flank pain. EXAM: CT ABDOMEN AND PELVIS WITHOUT CONTRAST TECHNIQUE: Multidetector CT imaging of the abdomen and pelvis was performed following the standard protocol without IV contrast. RADIATION DOSE REDUCTION: This exam was performed according to the departmental dose-optimization program which includes automated exposure control, adjustment of the mA and/or kV according to patient size and/or use of iterative  reconstruction technique. COMPARISON:  None Available. FINDINGS: Lower chest: No acute abnormality. Hepatobiliary: No focal liver abnormality is seen. No gallstones, gallbladder wall thickening, or biliary dilatation. Pancreas: Unremarkable. No pancreatic ductal dilatation or surrounding inflammatory changes. Spleen: Normal in size without focal abnormality. Adrenals/Urinary Tract: Adrenal glands are unremarkable. Minimal left hydronephrosis is noted secondary to 3 mm calculus in proximal left ureter. Bladder is unremarkable. Stomach/Bowel:  Stomach is within normal limits. Appendix appears normal. No evidence of bowel wall thickening, distention, or inflammatory changes. Vascular/Lymphatic: No significant vascular findings are present. No enlarged abdominal or pelvic lymph nodes. Reproductive: Uterus and bilateral adnexa are unremarkable. Other: No abdominal wall hernia or abnormality. No abdominopelvic ascites. Musculoskeletal: No acute or significant osseous findings. IMPRESSION: Minimal left hydronephrosis is noted secondary to 3 mm calculus in proximal left ureter. Electronically Signed   By: Lynwood Landy Raddle M.D.   On: 03/01/2024 13:53   DG Abd 2 Views Result Date: 03/01/2024 CLINICAL DATA:  Hematuria, left flank pain. EXAM: ABDOMEN - 2 VIEW COMPARISON:  None Available. FINDINGS: The bowel gas pattern is normal. There is no evidence of free air. No radio-opaque calculi or other significant radiographic abnormality is seen. IMPRESSION: Negative. Electronically Signed   By: Lynwood Landy Raddle M.D.   On: 03/01/2024 12:36     Procedures Procedures (including critical care time)  Medications Ordered in UC Medications  ketorolac  (TORADOL ) injection 30 mg (30 mg Intramuscular Given 03/01/24 1226)    Initial Impression / Assessment and Plan / UC Course  I have reviewed the triage vital signs and the nursing notes.  Pertinent labs & imaging results that were available during my care of the patient were  reviewed by me and considered in my medical decision making (see chart for details).       Patient is a  28 y.o. female who presents after having insidious left flank pain that started this early morning.  Patient is uncomfortable appearing and unable to sit still on the exam table.  She is hypertensive and tachycardic.  Morganis afebrile and satting well on room air.  Given Toradol  30 mg IM for pain.  Urinalysis obtained and showing no evidence of urinary tract infection however she does have some hematuria that was supported on microscopy.  Given that her period was a couple weeks ago I am concerned that given her presentation that she has a kidney stone.  Recommended ED evaluation however she would like to avoid this at this time.  Patient requested a KUB.  Urine pregnancy is negative.  CBC and BMP are unremarkable.    KUB obtained but did not show a kidney stone.  I again recommended ED evaluation however the x-ray technician here today can also run the CT scanner and patient requested CT be performed here.  After insurance approval CT abdomen pelvis obtained.  On my review prior to the radiologist read,  there was a hypodense area in the left ureter with what appears to be fluid backup in the kidneys.  Recommended ED evaluation again however patient would like to wait for her CT to be read.   CT abdomen pelvis radiologist were read returned and patient with mild hydronephrosis and 3 mm left ureter stone in the proximal ureter.  Given concern for possible obstruction recommended again ED evaluation. Pt reluctant to go to the ED as she is leaving for vacation soon but will go to [facility] ED for further evaluation as she likely needs to see a urologist.     Discussed MDM, treatment plan and plan for follow-up with patient who agrees with plan.    Final Clinical Impressions(s) / UC Diagnoses   Final diagnoses:  Acute left flank pain  Microscopic hematuria  Hydronephrosis of left kidney      Discharge Instructions      Your CT showed minimal left hydronephrosis is noted secondary to 3 mm calculus in proximal  left ureter.   You have been advised to follow up immediately in the emergency department for concerning signs or symptoms as discussed during your visit. If you declined EMS transport, please have a family member take you directly to the ED at this time. Do not delay.   Based on concerns about condition, if you do not follow up in the ED, you may risk poor outcomes including worsening of condition, delayed treatment and potentially life threatening issues. If you have declined to go to the ED at this time, you should call your PCP immediately to set up a follow up appointment.   Go to ED for red flag symptoms, including; fevers you cannot reduce with Tylenol /Motrin , severe headaches, vision changes, numbness/weakness in part of the body, lethargy, confusion, intractable vomiting, severe dehydration, chest pain, breathing difficulty, severe persistent abdominal or pelvic pain, signs of severe infection (increased redness, swelling of an area), feeling faint or passing out, dizziness, etc. You should especially go to the ED for sudden acute worsening of condition if you do not elect to go at this time.       ED Prescriptions   None    PDMP not reviewed this encounter.   Siena Poehler, DO 03/02/24 1039

## 2024-03-01 NOTE — ED Triage Notes (Signed)
 Pt states that she has some left side, lower flank pain. Pt states that she also has some nausea. X1 day

## 2024-03-01 NOTE — ED Triage Notes (Signed)
 Pt comes with left sided back pain. Pt states blood in urine.  Pt went to UC and had bloodwork done. Pt dx with kidney stone.

## 2024-03-01 NOTE — ED Provider Triage Note (Signed)
 Emergency Medicine Provider Triage Evaluation Note  Laurie Mendez , a 28 y.o. female  was evaluated in triage.  Pt complains of left-sided flank pain.  Patient presents medical urgent care, with confirmation of a left-sided 3 mL stone with some hydronephrosis.  She presents to the ED endorsing significant pain.  No reports of any nausea, vomiting, fever, chills, or sweats.  Review of Systems  Positive: Left flank pain Negative: FCS  Physical Exam  BP (!) 134/95   Pulse (!) 118   Temp (!) 97.5 F (36.4 C)   Resp 18   LMP 02/16/2024   SpO2 100%  Gen:   Awake, no distress NAD Resp:  Normal effort CTA MSK:   Moves extremities without difficulty  Other:  Soft and nontender  Medical Decision Making  Medically screening exam initiated at 3:59 PM.  Appropriate orders placed.  Laurie Mendez was informed that the remainder of the evaluation will be completed by another provider, this initial triage assessment does not replace that evaluation, and the importance of remaining in the ED until their evaluation is complete.  Patient to the ED for evaluation of left flank pain secondary to nephrolithiasis and hydronephrosis.   Loyd Candida LULLA Aldona, PA-C 03/01/24 [phone]

## 2024-03-01 NOTE — ED Provider Notes (Signed)
 [facility] Emergency Department Provider Note     Event Date/Time   First MD Initiated Contact with Patient 03/01/24 1707     (approximate)   History   Back Pain   HPI  Laurie Mendez is a 28 y.o. female with a noncontributory medical history, presents to the ED endorsing left-sided flank pain and hematuria.  She presented to an urgent care for evaluation, and was found by outpatient CT to have a left-sided stone with mild hydronephrosis.  Patient presents to the ED afebrile and in no acute distress.  She is passing urine without difficulty.  She denies any nausea, vomiting, or bowel changes.  This is her first known history of kidney stones.  Physical Exam   Triage Vital Signs: ED Triage Vitals  Encounter Vitals Group     BP 03/01/24 1521 (!) 134/95     Girls Systolic BP Percentile --      Girls Diastolic BP Percentile --      Boys Systolic BP Percentile --      Boys Diastolic BP Percentile --      Pulse Rate 03/01/24 1521 (!) 118     Resp 03/01/24 1521 18     Temp 03/01/24 1521 (!) 97.5 F (36.4 C)     Temp src --      SpO2 03/01/24 1521 100 %     Weight --      Height --      Head Circumference --      Peak Flow --      Pain Score 03/01/24 1520 6     Pain Loc --      Pain Education --      Exclude from Growth Chart --     Most recent vital signs: Vitals:   03/01/24 1521 03/01/24 1941  BP: (!) 134/95 137/80  Pulse: (!) 118 82  Resp: 18   Temp: (!) 97.5 F (36.4 C) (!) 97.5 F (36.4 C)  SpO2: 100% 100%    General Awake, no distress. NAD HEENT NCAT. PERRL. EOMI. No rhinorrhea. Mucous membranes are moist.  CV:  Good peripheral perfusion. RRR RESP:  Normal effort. CTA ABD:  No distention. Mild left flank pain   ED Results / Procedures / Treatments   Labs (all labs ordered are listed, but only abnormal results are displayed) Labs Reviewed - No data to display   EKG   RADIOLOGY  I personally viewed and evaluated these  images as part of my medical decision making, as well as reviewing the written report by the radiologist.  ED Provider Interpretation: Mild left hydronephrosis secondary to a nonobstructive 3 mL calculus in the proximal left ureter  CT Renal Stone Study Result Date: 03/01/2024 CLINICAL DATA:  Left flank pain. EXAM: CT ABDOMEN AND PELVIS WITHOUT CONTRAST TECHNIQUE: Multidetector CT imaging of the abdomen and pelvis was performed following the standard protocol without IV contrast. RADIATION DOSE REDUCTION: This exam was performed according to the departmental dose-optimization program which includes automated exposure control, adjustment of the mA and/or kV according to patient size and/or use of iterative reconstruction technique. COMPARISON:  None Available. FINDINGS: Lower chest: No acute abnormality. Hepatobiliary: No focal liver abnormality is seen. No gallstones, gallbladder wall thickening, or biliary dilatation. Pancreas: Unremarkable. No pancreatic ductal dilatation or surrounding inflammatory changes. Spleen: Normal in size without focal abnormality. Adrenals/Urinary Tract: Adrenal glands are unremarkable. Minimal left hydronephrosis is noted secondary to 3 mm calculus in proximal left ureter. Bladder  is unremarkable. Stomach/Bowel: Stomach is within normal limits. Appendix appears normal. No evidence of bowel wall thickening, distention, or inflammatory changes. Vascular/Lymphatic: No significant vascular findings are present. No enlarged abdominal or pelvic lymph nodes. Reproductive: Uterus and bilateral adnexa are unremarkable. Other: No abdominal wall hernia or abnormality. No abdominopelvic ascites. Musculoskeletal: No acute or significant osseous findings. IMPRESSION: Minimal left hydronephrosis is noted secondary to 3 mm calculus in proximal left ureter. Electronically Signed   By: Lynwood Landy Raddle M.D.   On: 03/01/2024 13:53   DG Abd 2 Views Result Date: 03/01/2024 CLINICAL DATA:  Hematuria,  left flank pain. EXAM: ABDOMEN - 2 VIEW COMPARISON:  None Available. FINDINGS: The bowel gas pattern is normal. There is no evidence of free air. No radio-opaque calculi or other significant radiographic abnormality is seen. IMPRESSION: Negative. Electronically Signed   By: Lynwood Landy Raddle M.D.   On: 03/01/2024 12:36     PROCEDURES:  Critical Care performed: No  Procedures   MEDICATIONS ORDERED IN ED: Medications  sodium chloride  0.9 % bolus 1,000 mL (0 mLs Intravenous Stopped 03/01/24 1940)  ondansetron  (ZOFRAN ) injection 4 mg (4 mg Intravenous Given 03/01/24 1755)  ketorolac  (TORADOL ) 30 MG/ML injection 30 mg (30 mg Intravenous Given 03/01/24 1756)  tamsulosin  (FLOMAX ) capsule 0.4 mg (0.4 mg Oral Given 03/01/24 1758)     IMPRESSION / MDM / ASSESSMENT AND PLAN / ED COURSE  I reviewed the triage vital signs and the nursing notes.                              Differential diagnosis includes, but is not limited to, ovarian cyst, ovarian torsion, acute appendicitis, diverticulitis, urinary tract infection/pyelonephritis, endometriosis, bowel obstruction, colitis, renal colic, gastroenteritis, hernia, fibroids, pregnancy related pain including ectopic pregnancy, etc.  Patient's presentation is most consistent with acute complicated illness / injury requiring diagnostic workup.  Patient's diagnosis is consistent with left flank pain secondary to kidney stone in the ureter.  Patient presents in no acute distress from local urgent care, for evaluation management of her left flank pain.  She is afebrile with a mild tachycardia on presentation.labs are reassuring with no elevated WBCs or evidence of of AKI.  Patient treated in the ED with an IV fluid bolus, IV pain meds, and p.o. tamsulosin .  Patient will be discharged home with prescriptions for oxycodone , tamsulosin , and Zofran . Patient is to follow up with urology as needed or otherwise directed. Patient is given ED precautions to return to the ED  for any worsening or new symptoms.   FINAL CLINICAL IMPRESSION(S) / ED DIAGNOSES   Final diagnoses:  Ureterolithiasis     Rx / DC Orders   ED Discharge Orders          Ordered    ondansetron  (ZOFRAN -ODT) 4 MG disintegrating tablet  Every 8 hours PRN        03/01/24 1923    tamsulosin  (FLOMAX ) 0.4 MG CAPS capsule  Daily after supper        03/01/24 1923    oxyCODONE -acetaminophen  (PERCOCET) 5-325 MG tablet  Every 8 hours PRN        03/01/24 1923             Note:  This document was prepared using Dragon voice recognition software and may include unintentional dictation errors.    Loyd Candida LULLA Aldona, PA-C 03/02/24 0005    Clarine Ozell LABOR, MD 03/04/24 [phone]

## 2024-03-01 NOTE — Discharge Instructions (Addendum)
 Your CT showed minimal left hydronephrosis is noted secondary to 3 mm calculus in proximal left ureter.   You have been advised to follow up immediately in the emergency department for concerning signs or symptoms as discussed during your visit. If you declined EMS transport, please have a family member take you directly to the ED at this time. Do not delay.   Based on concerns about condition, if you do not follow up in the ED, you may risk poor outcomes including worsening of condition, delayed treatment and potentially life threatening issues. If you have declined to go to the ED at this time, you should call your PCP immediately to set up a follow up appointment.   Go to ED for red flag symptoms, including; fevers you cannot reduce with Tylenol /Motrin , severe headaches, vision changes, numbness/weakness in part of the body, lethargy, confusion, intractable vomiting, severe dehydration, chest pain, breathing difficulty, severe persistent abdominal or pelvic pain, signs of severe infection (increased redness, swelling of an area), feeling faint or passing out, dizziness, etc. You should especially go to the ED for sudden acute worsening of condition if you do not elect to go at this time.

## 2024-03-01 NOTE — ED Notes (Signed)
 Patient is being discharged from the Urgent Care and sent to the [facility] Emergency Department via private vehicle . Per Dr. Kriste, patient is in need of higher level of care due to Kidney Stones and left Flank Pain. Patient is aware and verbalizes understanding of plan of care.  Vitals:   03/01/24 1051  BP: (!) 135/102  Pulse: (!) 103  Resp: 18  Temp: 98.5 F (36.9 C)  SpO2: 99%

## 2024-03-01 NOTE — Discharge Instructions (Addendum)
 Take the prescription meds as directed.  Continue to hydrate and strain your urine as necessary.  Follow-up with urology or your PCP as discussed.

## 2024-03-12 ENCOUNTER — Encounter: Payer: Self-pay | Admitting: Family Medicine

## 2024-03-15 ENCOUNTER — Encounter: Payer: Self-pay | Admitting: Family Medicine

## 2024-03-15 ENCOUNTER — Ambulatory Visit: Admitting: Family Medicine

## 2024-03-15 VITALS — BP 110/74 | HR 88 | Ht 64.0 in | Wt 231.4 lb

## 2024-03-15 DIAGNOSIS — N200 Calculus of kidney: Secondary | ICD-10-CM | POA: Diagnosis not present

## 2024-03-15 DIAGNOSIS — N1330 Unspecified hydronephrosis: Secondary | ICD-10-CM

## 2024-03-15 MED ORDER — OXYCODONE-ACETAMINOPHEN 5-325 MG PO TABS: 1.0000 | ORAL_TABLET | Freq: Three times a day (TID) | ORAL | 0 refills | Status: DC | PRN

## 2024-03-15 MED ORDER — CYCLOBENZAPRINE HCL 10 MG PO TABS: 10.0000 mg | ORAL_TABLET | Freq: Three times a day (TID) | ORAL | 0 refills | Status: DC | PRN

## 2024-03-15 MED ORDER — TAMSULOSIN HCL 0.4 MG PO CAPS
0.4000 mg | ORAL_CAPSULE | Freq: Every day | ORAL | 0 refills | Status: DC
Start: 2024-03-15 — End: 2024-04-04

## 2024-03-15 NOTE — Progress Notes (Signed)
 Subjective:    Patient ID: Laurie Mendez, female    DOB: [birth year], 28 y.o.   MRN: [id number]  Laurie Mendez is a 28 y.o. female presenting on 03/15/2024 for Flank Pain (Kidney stone)   HPI  Discussed the use of AI scribe software for clinical note transcription with the patient, who gave verbal consent to proceed.  History of Present Illness   Laurie Mendez is a 28 year old female who presents with ongoing symptoms related to a kidney stone.  ED FOLLOW-UP VISIT  Hospital/Location: [facility] ED and also [facility] first Date of ED Visit: 03/01/24  Reason for Presenting to ED: Kidney Stone  FOLLOW-UP  - ED provider note and record have been reviewed - Patient presents today about 14 days   Acute Left Renal colic and flank pain Recent course / background - On August 22, experienced sharp left flank pain at 5 AM, initially attributed to a pulled muscle - Pain persisted despite Tylenol  use, prompting urgent care evaluation, sent to ED after imaging - CT scan revealed a 3 mm kidney stone in the left proximal ureter with minimal left hydronephrosis - Received IV fluids and Toradol  injection in the ER - Prescribed Flomax , Percocet, and Zofran  - Took Flomax  for ten days, 30 minutes after dinner each day - Has not yet passed the stone - Has two Percocet pills remaining; last used on Tuesday - Used Zofran  three times for nausea associated with severe pain  - Sharp pain lasted approximately eight days, then transitioned to a dull ache - Currently has a dull ache in the left flank  Urinary symptoms - Occasional urinary urgency - Presence of white floating particles in urine - No obstructive urinary symptoms - No fever or chills - Passing adequate amounts of urine - Using a strainer to monitor for stone passage   First episode of nephrolithiasis - First experience with a kidney stone - Documenting days and times of medication use    - Today reports overall has  done well after discharge from ED. Symptoms of kidney stone have improved. Not resolved  - New medications on discharge: Flomax , Oxycodone  Percocet, Zofran  - Changes to current meds on discharge: none  I have reviewed the discharge medication list, and have reconciled the current and discharge medications today.       03/15/2024   10:05 AM 12/28/2023   11:19 AM 11/24/2022   10:53 AM  Depression screen PHQ 2/9  Decreased Interest 0 0 0  Down, Depressed, Hopeless 0 0 0  PHQ - 2 Score 0 0 0  Altered sleeping  0   Tired, decreased energy  0   Change in appetite  0   Feeling bad or failure about yourself   0   Trouble concentrating  0   Moving slowly or fidgety/restless  0   Suicidal thoughts  0   PHQ-9 Score  0        03/15/2024   10:05 AM 12/28/2023   11:19 AM 11/24/2022   10:54 AM 11/18/2021   10:51 AM  GAD 7 : Generalized Anxiety Score  Nervous, Anxious, on Edge 0 1 0 0  Control/stop worrying 0 1 0 0  Worry too much - different things 0 1 0 0  Trouble relaxing 0 1 0 0  Restless 0 0 0 0  Easily annoyed or irritable 0 1 0 0  Afraid - awful might happen 0 0 0 0  Total GAD 7  Score 0 5 0 0  Anxiety Difficulty  Not difficult at all  Not difficult at all    Social History   Tobacco Use   Smoking status: Never   Smokeless tobacco: Never  Vaping Use   Vaping status: Never Used  Substance Use Topics   Alcohol use: No   Drug use: No    Review of Systems Per HPI unless specifically indicated above     Objective:    BP 110/74 (BP Location: Left Arm, Patient Position: Sitting, Cuff Size: Normal)   Pulse 88   Ht 5' 4 (1.626 m)   Wt 231 lb 6 oz (105 kg)   LMP 02/16/2024   SpO2 97%   BMI 39.72 kg/m   Wt Readings from Last 3 Encounters:  03/15/24 231 lb 6 oz (105 kg)  03/01/24 228 lb (103.4 kg)  12/28/23 228 lb 4 oz (103.5 kg)    Physical Exam Vitals and nursing note reviewed.  Constitutional:      General: She is not in acute distress.    Appearance: Normal  appearance. She is well-developed. She is not diaphoretic.     Comments: Well-appearing, comfortable, cooperative  HENT:     Head: Normocephalic and atraumatic.  Eyes:     General:        Right eye: No discharge.        Left eye: No discharge.     Conjunctiva/sclera: Conjunctivae normal.  Cardiovascular:     Rate and Rhythm: Normal rate.  Pulmonary:     Effort: Pulmonary effort is normal.  Skin:    General: Skin is warm and dry.     Findings: No erythema or rash.  Neurological:     Mental Status: She is alert and oriented to person, place, and time.  Psychiatric:        Mood and Affect: Mood normal.        Behavior: Behavior normal.        Thought Content: Thought content normal.     Comments: Well groomed, good eye contact, normal speech and thoughts      Study Result CLINICAL DATA: Hematuria, left flank pain.  EXAM: ABDOMEN - 2 VIEW  COMPARISON: None Available.  FINDINGS: The bowel gas pattern is normal. There is no evidence of free air. No radio-opaque calculi or other significant radiographic abnormality is seen.  IMPRESSION: Negative.   Electronically Signed By: Lynwood Landy Raddle M.D. On: 03/01/2024 12:36  ----   Study Result CLINICAL DATA: Left flank pain.  EXAM: CT ABDOMEN AND PELVIS WITHOUT CONTRAST  TECHNIQUE: Multidetector CT imaging of the abdomen and pelvis was performed following the standard protocol without IV contrast.  RADIATION DOSE REDUCTION: This exam was performed according to the departmental dose-optimization program which includes automated exposure control, adjustment of the mA and/or kV according to patient size and/or use of iterative reconstruction technique.  COMPARISON: None Available.  FINDINGS: Lower chest: No acute abnormality.  Hepatobiliary: No focal liver abnormality is seen. No gallstones, gallbladder wall thickening, or biliary dilatation.  Pancreas: Unremarkable. No pancreatic ductal dilatation  or surrounding inflammatory changes.  Spleen: Normal in size without focal abnormality.  Adrenals/Urinary Tract: Adrenal glands are unremarkable. Minimal left hydronephrosis is noted secondary to 3 mm calculus in proximal left ureter. Bladder is unremarkable.  Stomach/Bowel: Stomach is within normal limits. Appendix appears normal. No evidence of bowel wall thickening, distention, or inflammatory changes.  Vascular/Lymphatic: No significant vascular findings are present. No enlarged abdominal or pelvic lymph nodes.  Reproductive: Uterus and bilateral adnexa are unremarkable.  Other: No abdominal wall hernia or abnormality. No abdominopelvic ascites.  Musculoskeletal: No acute or significant osseous findings.  IMPRESSION: Minimal left hydronephrosis is noted secondary to 3 mm calculus in proximal left ureter.   Electronically Signed By: Lynwood Landy Raddle M.D. On: 03/01/2024 13:53  Results for orders placed or performed during the hospital encounter of 03/01/24  Urinalysis, w/ Reflex to Culture (Infection Suspected) -Urine, Clean Catch   Collection Time: 03/01/24 10:58 AM  Result Value Ref Range   Specimen Source URINE, CLEAN CATCH    Color, Urine YELLOW YELLOW   APPearance HAZY (A) CLEAR   Specific Gravity, Urine 1.020 1.005 - 1.030   pH 7.0 5.0 - 8.0   Glucose, UA NEGATIVE NEGATIVE mg/dL   Hgb urine dipstick MODERATE (A) NEGATIVE   Bilirubin Urine NEGATIVE NEGATIVE   Ketones, ur NEGATIVE NEGATIVE mg/dL   Protein, ur NEGATIVE NEGATIVE mg/dL   Nitrite NEGATIVE NEGATIVE   Leukocytes,Ua NEGATIVE NEGATIVE   Squamous Epithelial / HPF 0-5 0 - 5 /HPF   WBC, UA 0-5 0 - 5 WBC/hpf   RBC / HPF 21-50 0 - 5 RBC/hpf   Bacteria, UA FEW (A) NONE SEEN   Mucus PRESENT   Pregnancy, urine   Collection Time: 03/01/24 10:58 AM  Result Value Ref Range   Preg Test, Ur NEGATIVE NEGATIVE  Basic metabolic panel   Collection Time: 03/01/24 11:22 AM  Result Value Ref Range   Sodium 135 135  - 145 mmol/L   Potassium 3.9 3.5 - 5.1 mmol/L   Chloride 101 98 - 111 mmol/L   CO2 22 22 - 32 mmol/L   Glucose, Bld 111 (H) 70 - 99 mg/dL   BUN 9 6 - 20 mg/dL   Creatinine, Ser 9.30 0.44 - 1.00 mg/dL   Calcium 9.3 8.9 - 89.6 mg/dL   GFR, Estimated >39 >39 mL/min   Anion gap 12 5 - 15  CBC with Differential   Collection Time: 03/01/24 11:22 AM  Result Value Ref Range   WBC 9.2 4.0 - 10.5 K/uL   RBC 4.70 3.87 - 5.11 MIL/uL   Hemoglobin 14.6 12.0 - 15.0 g/dL   HCT 58.6 63.9 - 53.9 %   MCV 87.9 80.0 - 100.0 fL   MCH 31.1 26.0 - 34.0 pg   MCHC 35.4 30.0 - 36.0 g/dL   RDW 87.8 88.4 - 84.4 %   Platelets 384 150 - 400 K/uL   nRBC 0.0 0.0 - 0.2 %   Neutrophils Relative % 73 %   Neutro Abs 6.8 1.7 - 7.7 K/uL   Lymphocytes Relative 21 %   Lymphs Abs 1.9 0.7 - 4.0 K/uL   Monocytes Relative 5 %   Monocytes Absolute 0.4 0.1 - 1.0 K/uL   Eosinophils Relative 1 %   Eosinophils Absolute 0.1 0.0 - 0.5 K/uL   Basophils Relative 0 %   Basophils Absolute 0.0 0.0 - 0.1 K/uL   Immature Granulocytes 0 %   Abs Immature Granulocytes 0.03 0.00 - 0.07 K/uL      Assessment & Plan:   Problem List Items Addressed This Visit   None Visit Diagnoses       Left nephrolithiasis    -  Primary   Relevant Medications   tamsulosin  (FLOMAX ) 0.4 MG CAPS capsule   oxyCODONE -acetaminophen  (PERCOCET/ROXICET) 5-325 MG tablet   cyclobenzaprine  (FLEXERIL ) 10 MG tablet     Hydronephrosis of left kidney       Relevant Medications  tamsulosin  (FLOMAX ) 0.4 MG CAPS capsule   oxyCODONE -acetaminophen  (PERCOCET/ROXICET) 5-325 MG tablet   cyclobenzaprine  (FLEXERIL ) 10 MG tablet       ED Follow-up Left ureteral calculus with minimal left hydronephrosis 3 mm calculus in left proximal ureter causing minimal hydronephrosis. Symptoms improved with Flomax , Percocet, and Zofran . No current obstruction, urine passage adequate. She is straining urine collection to identify if passes stone, has not seen stone but has seen  white particles in urine  - Refilled Flomax  (tamsulosin ) for 30 days - advised continue longer than the 10 days, now can use another 2+ weeks if need. - Refilled Percocet (oxycodone ) with 15 pills for pain management as needed only. Seems pain improving now however. - Prescribed Flexeril  (cyclobenzaprine ) for additional pain relief if needed. For ureterospasm if need - Advised use of urine strainer to catch stone. - Instructed to return if symptoms worsen or new symptoms such as fever, chills, nausea, or vomiting occur. - Consider urology consultation if symptoms persist or worsen after two weeks. - Sent prescriptions to [facility] in [place].        No orders of the defined types were placed in this encounter.   Meds ordered this encounter  Medications   tamsulosin  (FLOMAX ) 0.4 MG CAPS capsule    Sig: Take 1 capsule (0.4 mg total) by mouth daily after supper. For kidney stone    Dispense:  30 capsule    Refill:  0   oxyCODONE -acetaminophen  (PERCOCET/ROXICET) 5-325 MG tablet    Sig: Take 1 tablet by mouth every 8 (eight) hours as needed for severe pain (pain score 7-10).    Dispense:  15 tablet    Refill:  0   cyclobenzaprine  (FLEXERIL ) 10 MG tablet    Sig: Take 1 tablet (10 mg total) by mouth 3 (three) times daily as needed for muscle spasms.    Dispense:  30 tablet    Refill:  0    Follow up plan: Return if symptoms worsen or fail to improve.   Marsa Officer, DO [facility] 03/15/2024, 10:22 AM

## 2024-03-15 NOTE — Patient Instructions (Addendum)
 Thank you for coming to the office today.  Hopefully we can pass the kidney stone  Seems to be minimal or minor. We can wait on repeat imaging and Urology at this point  Refill Flomax  to continue for 2-4 more weeks  If still having pain by 2+ more weeks from now or worsening pain or any new concerning symptoms,  message call come back and we can consider Urology consultation  Refilled Percocet as needed  May substitute for muscle relaxant instead  Start Cyclobenzapine (Flexeril ) 10mg  tablets (muscle relaxant) - start with half (cut) to one whole pill at night for muscle relaxant - may make you sedated or sleepy (be careful driving or working on this) if tolerated you can take half to whole tab 2 to 3 times daily or every 8 hours as needed    Please schedule a Follow-up Appointment to: Return if symptoms worsen or fail to improve.  If you have any other questions or concerns, please feel free to call the office or send a message through MyChart. You may also schedule an earlier appointment if necessary.  Additionally, you may be receiving a survey about your experience at our office within a few days to 1 week by e-mail or mail. We value your feedback.  Marsa Officer, DO [facility], [place]

## 2024-04-04 ENCOUNTER — Telehealth: Admitting: Physician Assistant

## 2024-04-04 DIAGNOSIS — J039 Acute tonsillitis, unspecified: Secondary | ICD-10-CM | POA: Diagnosis not present

## 2024-04-04 MED ORDER — AZITHROMYCIN 250 MG PO TABS: ORAL_TABLET | ORAL | 0 refills | Status: AC

## 2024-04-04 NOTE — Progress Notes (Signed)
 E-Visit for Sore Throat - Strep Symptoms  We are sorry that you are not feeling well.  Here is how we plan to help!  Based on what you have shared with me it is likely that you have strep pharyngitis.  Strep pharyngitis is inflammation and infection in the back of the throat.  This is an infection cause by bacteria and is treated with antibiotics.  I have prescribed Azithromycin 250 mg two tablets today and then one daily for 4 additional days. For throat pain, we recommend over the counter oral pain relief medications such as acetaminophen or aspirin, or anti-inflammatory medications such as ibuprofen or naproxen sodium. Topical treatments such as oral throat lozenges or sprays may be used as needed. Strep infections are not as easily transmitted as other respiratory infections, however we still recommend that you avoid close contact with loved ones, especially the very young and elderly.  Remember to wash your hands thoroughly throughout the day as this is the number one way to prevent the spread of infection and wipe down door knobs and counters with disinfectant.   Home Care: Only take medications as instructed by your medical team. Complete the entire course of an antibiotic. Do not take these medications with alcohol. A steam or ultrasonic humidifier can help congestion.  You can place a towel over your head and breathe in the steam from hot water coming from a faucet. Avoid close contacts especially the very young and the elderly. Cover your mouth when you cough or sneeze. Always remember to wash your hands.  Get Help Right Away If: You develop worsening fever or sinus pain. You develop a severe head ache or visual changes. Your symptoms persist after you have completed your treatment plan.  Make sure you Understand these instructions. Will watch your condition. Will get help right away if you are not doing well or get worse.   Thank you for choosing an e-visit.  Your e-visit  answers were reviewed by a board certified advanced clinical practitioner to complete your personal care plan. Depending upon the condition, your plan could have included both over the counter or prescription medications.  Please review your pharmacy choice. Make sure the pharmacy is open so you can pick up prescription now. If there is a problem, you may contact your provider through [employer] and have the prescription routed to another pharmacy.  Your safety is important to [place]. If you have drug allergies check your prescription carefully.   For the next 24 hours you can use MyChart to ask questions about today's visit, request a non-urgent call back, or ask for a work or school excuse. You will get an email in the next two days asking about your experience. I hope that your e-visit has been valuable and will speed your recovery.  I have spent 5 minutes in review of e-visit questionnaire, review and updating patient chart, medical decision making and response to patient.   Margaretann Loveless, PA-C

## 2025-01-03 ENCOUNTER — Encounter: Admitting: Family Medicine
